# Patient Record
Sex: Female | Born: 1995 | Hispanic: Yes | Marital: Single | Smoking: Former smoker
Health system: Southern US, Community
[De-identification: ages and names within clinical notes are randomized; demographics above are authoritative.]

## PROBLEM LIST (undated history)

## (undated) DIAGNOSIS — IMO0002 Reserved for concepts with insufficient information to code with codable children: Secondary | ICD-10-CM

## (undated) DIAGNOSIS — M329 Systemic lupus erythematosus, unspecified: Secondary | ICD-10-CM

## (undated) DIAGNOSIS — E040 Nontoxic diffuse goiter: Secondary | ICD-10-CM

## (undated) DIAGNOSIS — D649 Anemia, unspecified: Secondary | ICD-10-CM

## (undated) DIAGNOSIS — N8003 Adenomyosis of the uterus: Secondary | ICD-10-CM

## (undated) DIAGNOSIS — N390 Urinary tract infection, site not specified: Secondary | ICD-10-CM

## (undated) DIAGNOSIS — H539 Unspecified visual disturbance: Secondary | ICD-10-CM

## (undated) DIAGNOSIS — F419 Anxiety disorder, unspecified: Secondary | ICD-10-CM

## (undated) DIAGNOSIS — F819 Developmental disorder of scholastic skills, unspecified: Secondary | ICD-10-CM

## (undated) DIAGNOSIS — K219 Gastro-esophageal reflux disease without esophagitis: Secondary | ICD-10-CM

## (undated) DIAGNOSIS — T7840XA Allergy, unspecified, initial encounter: Secondary | ICD-10-CM

## (undated) DIAGNOSIS — F32A Depression, unspecified: Secondary | ICD-10-CM

## (undated) DIAGNOSIS — G709 Myoneural disorder, unspecified: Secondary | ICD-10-CM

## (undated) HISTORY — DX: Developmental disorder of scholastic skills, unspecified: F81.9

## (undated) HISTORY — PX: NO PAST SURGERIES: SHX2092

---
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
## 2013-01-31 ENCOUNTER — Other Ambulatory Visit: Payer: Self-pay | Admitting: "Endocrinology

## 2013-04-29 ENCOUNTER — Encounter (HOSPITAL_COMMUNITY): Payer: Self-pay | Admitting: Emergency Medicine

## 2013-04-29 ENCOUNTER — Emergency Department (HOSPITAL_COMMUNITY)
Admission: EM | Admit: 2013-04-29 | Discharge: 2013-04-29 | Disposition: A | Discharge status: Home / Self Care | Payer: Medicaid Other | Attending: Emergency Medicine | Admitting: Emergency Medicine

## 2013-04-29 DIAGNOSIS — L509 Urticaria, unspecified: Secondary | ICD-10-CM

## 2013-04-29 DIAGNOSIS — Z8659 Personal history of other mental and behavioral disorders: Secondary | ICD-10-CM | POA: Insufficient documentation

## 2013-04-29 DIAGNOSIS — Z79899 Other long term (current) drug therapy: Secondary | ICD-10-CM | POA: Insufficient documentation

## 2013-04-29 DIAGNOSIS — Z87440 Personal history of urinary (tract) infections: Secondary | ICD-10-CM | POA: Insufficient documentation

## 2013-04-29 DIAGNOSIS — E119 Type 2 diabetes mellitus without complications: Secondary | ICD-10-CM | POA: Insufficient documentation

## 2013-04-29 DIAGNOSIS — Z794 Long term (current) use of insulin: Secondary | ICD-10-CM | POA: Insufficient documentation

## 2013-04-29 DIAGNOSIS — Z8669 Personal history of other diseases of the nervous system and sense organs: Secondary | ICD-10-CM | POA: Insufficient documentation

## 2013-04-29 DIAGNOSIS — J45909 Unspecified asthma, uncomplicated: Secondary | ICD-10-CM | POA: Insufficient documentation

## 2013-04-29 MED ORDER — DEXAMETHASONE SODIUM PHOSPHATE 10 MG/ML IJ SOLN
10.0000 mg | Freq: Once | INTRAMUSCULAR | Status: DC
  Filled 2013-04-29: qty 1

## 2013-04-29 MED ORDER — DEXAMETHASONE SODIUM PHOSPHATE 10 MG/ML IJ SOLN
10.0000 mg | Freq: Once | INTRAMUSCULAR | Status: AC
  Administered 2013-04-29: 10 mg via INTRAMUSCULAR

## 2013-04-29 MED ORDER — DIPHENHYDRAMINE HCL 25 MG PO CAPS
25.0000 mg | ORAL_CAPSULE | Freq: Once | ORAL | Status: AC
  Administered 2013-04-29: 25 mg via ORAL
  Filled 2013-04-29: qty 1

## 2013-04-29 NOTE — ED Provider Notes (Signed)
CSN: [id number]     Arrival date & time 04/29/13  2218 History   First MD Initiated Contact with Patient 04/29/13 2228     Chief Complaint  Patient presents with  . Rash   HPI  History provided by the patient. Patient is a 18 year old female with history of allergies to dust, asthma, diabetes who presents with complaints of worsening pruritic rash over her upper body. She first reports having an itchy spot on her right hand but later had several other spots appear on her arms, chest, neck and back. She states areas are very itchy. She has not used any medications or treatment for the symptoms. Denies any swelling of the lips, tongue or throat. No difficulty breathing or swallowing. She denies having similar reaction in the past. No change to her medications. Denies any new body lotions, soaps, shampoos, clothing or detergents. No contact with animals. No outdoor environmental contacts. She has not traveled or slept anywhere new recently.    Past Medical History  Diagnosis Date  . Allergy     dust   . Anxiety   . Asthma   . Diabetes mellitus   . Urinary tract infection   . Vision abnormalities     wears glasses  . Learning disability    Past Surgical History  Procedure Laterality Date  . No past surgeries     Family History  Problem Relation Age of Onset  . Adopted: Yes  . Early death Sister   . Asthma Maternal Aunt   . Depression Maternal Aunt   . Mental illness Maternal Aunt   . Asthma Maternal Grandmother   . Thyroid disease Maternal Grandmother   . Obesity Maternal Grandmother   . Heart disease Neg Hx   . Diabetes Neg Hx    History  Substance Use Topics  . Smoking status: Never Smoker   . Smokeless tobacco: Never Used  . Alcohol Use: No   OB History   Grav Para Term Preterm Abortions TAB SAB Ect Mult Living                 Review of Systems  Constitutional: Negative for fever and chills.  HENT: Negative for trouble swallowing and voice change.   Respiratory:  Negative for cough, shortness of breath, wheezing and stridor.   Skin: Positive for rash.  All other systems reviewed and are negative.      Allergies  Review of patient's allergies indicates no known allergies.  Home Medications   Current Outpatient Rx  Name  Route  Sig  Dispense  Refill  . ACCU-CHEK FASTCLIX LANCETS MISC   Does not apply   1 each by Does not apply route 3 (three) times daily. Check sugar 10 x daily   306 each   3     Lancets come in boxes of 102 each. Please dispense ...   . albuterol (PROVENTIL HFA;VENTOLIN HFA) 108 (90 BASE) MCG/ACT inhaler   Inhalation   Inhale 2 puffs into the lungs every 6 (six) hours as needed. For wheezing/shortness of breath         . insulin aspart protamine- aspart (NOVOLOG MIX 70/30) (70-30) 100 UNIT/ML injection   Subcutaneous   Inject 0.15 mLs (15 Units total) into the skin daily with supper.   10 mL   12   . insulin aspart protamine- aspart (NOVOLOG MIX 70/30) (70-30) 100 UNIT/ML injection   Subcutaneous   Inject 0.25 mLs (25 Units total) into the skin daily with breakfast.  10 mL   12   . Insulin Pen Needle 31G X 5 MM MISC      Use with insulin pen   200 each   6     For questions regarding this prescription please c ...    BP 127/75  Pulse 85  Temp(Src) 98.7 F (37.1 C) (Oral)  Resp 18  SpO2 98% Physical Exam  Nursing note and vitals reviewed. Constitutional: She is oriented to person, place, and time. She appears well-developed and well-nourished. No distress.  HENT:  Head: Normocephalic.  Mouth/Throat: Oropharynx is clear and moist.  Eyes: Conjunctivae are normal.  Neck: Normal range of motion. Neck supple. No tracheal deviation present.  Cardiovascular: Normal rate and regular rhythm.   Pulmonary/Chest: Effort normal and breath sounds normal. No stridor. No respiratory distress. She has no wheezes. She has no rales.  Abdominal: Soft.  Neurological: She is alert and oriented to person, place, and  time.  Skin: Skin is warm and dry. Rash noted. No erythema.  Urticarial type rash over bilateral arms, upper chest and neck and diffusely on the back.  Psychiatric: She has a normal mood and affect. Her behavior is normal.    ED Course  Procedures   DIAGNOSTIC STUDIES: Oxygen Saturation is 98% on room air.    COORDINATION OF CARE:  Nursing notes reviewed. Vital signs reviewed. Initial pt interview and examination performed.   10:32 PM-patient seen and evaluated. She appears well no acute distress. The lesions appear urticarial in nature. No specific history to indicate cause of allergic response. No swelling of the lips, tongue or throat area. Normal respirations. Discussed treat plan with pt at bedside, which includes IM dose of Decadron and Benadryl. Pt agrees with plan.     MDM   Final diagnoses:  Hives       Angus Sellereter S Ebert Forrester, PA-C 04/29/13 2250

## 2013-04-29 NOTE — ED Notes (Signed)
The lesions appear to be bug bites

## 2013-04-29 NOTE — ED Notes (Signed)
The  Pt is c/o a itching rash all over her body this afternoon.  No previous history.  No breathing difficulty.

## 2013-04-29 NOTE — ED Provider Notes (Signed)
Medical screening examination/treatment/procedure(s) were performed by non-physician practitioner and as supervising physician I was immediately available for consultation/collaboration.   EKG Interpretation None        Charles B. Sheldon, MD 04/29/13 2350 

## 2013-04-29 NOTE — ED Notes (Signed)
Pt just received IM injection.  Will be discharged in approx 30 min.

## 2013-04-29 NOTE — Discharge Instructions (Signed)
Please follow up with a primary care provider or dermatology specialist for continued evaluation and treatment.    Hives Hives are itchy, red, swollen areas of the skin. They can vary in size and location on your body. Hives can come and go for hours or several days (acute hives) or for several weeks (chronic hives). Hives do not spread from person to person (noncontagious). They may get worse with scratching, exercise, and emotional stress. CAUSES   Allergic reaction to food, additives, or drugs.  Infections, including the common cold.  Illness, such as vasculitis, lupus, or thyroid disease.  Exposure to sunlight, heat, or cold.  Exercise.  Stress.  Contact with chemicals. SYMPTOMS   Red or white swollen patches on the skin. The patches may change size, shape, and location quickly and repeatedly.  Itching.  Swelling of the hands, feet, and face. This may occur if hives develop deeper in the skin. DIAGNOSIS  Your caregiver can usually tell what is wrong by performing a physical exam. Skin or blood tests may also be done to determine the cause of your hives. In some cases, the cause cannot be determined. TREATMENT  Mild cases usually get better with medicines such as antihistamines. Severe cases may require an emergency epinephrine injection. If the cause of your hives is known, treatment includes avoiding that trigger.  HOME CARE INSTRUCTIONS   Avoid causes that trigger your hives.  Take antihistamines as directed by your caregiver to reduce the severity of your hives. Non-sedating or low-sedating antihistamines are usually recommended. Do not drive while taking an antihistamine.  Take any other medicines prescribed for itching as directed by your caregiver.  Wear loose-fitting clothing.  Keep all follow-up appointments as directed by your caregiver. SEEK MEDICAL CARE IF:   You have persistent or severe itching that is not relieved with medicine.  You have painful or  swollen joints. SEEK IMMEDIATE MEDICAL CARE IF:   You have a fever.  Your tongue or lips are swollen.  You have trouble breathing or swallowing.  You feel tightness in the throat or chest.  You have abdominal pain. These problems may be the first sign of a life-threatening allergic reaction. Call your local emergency services (911 in [place] MAKE SURE YOU:   Understand these instructions.  Will watch your condition.  Will get help right away if you are not doing well or get worse. Document Released: 02/01/2005 Document Revised: 08/03/2011 Document Reviewed: 04/27/2011 [facility] Patient Information 2014 [employer], [place].

## 2013-05-31 ENCOUNTER — Encounter (HOSPITAL_COMMUNITY): Payer: Self-pay | Admitting: Emergency Medicine

## 2013-05-31 ENCOUNTER — Emergency Department (HOSPITAL_COMMUNITY)
Admission: EM | Admit: 2013-05-31 | Discharge: 2013-05-31 | Discharge status: Against Medical Advice | Payer: Medicaid Other | Attending: Emergency Medicine | Admitting: Emergency Medicine

## 2013-05-31 DIAGNOSIS — E119 Type 2 diabetes mellitus without complications: Secondary | ICD-10-CM | POA: Insufficient documentation

## 2013-05-31 DIAGNOSIS — J45909 Unspecified asthma, uncomplicated: Secondary | ICD-10-CM | POA: Insufficient documentation

## 2013-05-31 LAB — URINE MICROSCOPIC-ADD ON

## 2013-05-31 LAB — URINALYSIS, ROUTINE W REFLEX MICROSCOPIC
Bilirubin Urine: NEGATIVE
Glucose, UA: >1000 mg/dL — AB
Hgb urine dipstick: NEGATIVE
Ketones, ur: 40 mg/dL — AB
Leukocytes, UA: NEGATIVE
Nitrite: NEGATIVE
Protein, ur: NEGATIVE mg/dL
Specific Gravity, Urine: 1.03 (ref 1.005–1.030)
Urobilinogen, UA: 0.2 mg/dL (ref 0.0–1.0)
pH: 6 (ref 5.0–8.0)

## 2013-05-31 LAB — COMPREHENSIVE METABOLIC PANEL
ALBUMIN: 3.9 g/dL (ref 3.5–5.2)
ALK PHOS: 68 U/L (ref 39–117)
ALT: 8 U/L (ref 0–35)
AST: 13 U/L (ref 0–37)
BUN: 13 mg/dL (ref 6–23)
CALCIUM: 10.6 mg/dL — AB (ref 8.4–10.5)
CO2: 19 mEq/L (ref 19–32)
CREATININE: 0.49 mg/dL — AB (ref 0.50–1.10)
Chloride: 88 mEq/L — ABNORMAL LOW (ref 96–112)
GFR calc non Af Amer: >90 mL/min (ref >90)
GLUCOSE: 868 mg/dL — AB (ref 70–99)
Potassium: 5 mEq/L (ref 3.7–5.3)
Sodium: 130 mEq/L — ABNORMAL LOW (ref 137–147)
TOTAL PROTEIN: 8.5 g/dL — AB (ref 6.0–8.3)
Total Bilirubin: 0.3 mg/dL (ref 0.3–1.2)

## 2013-05-31 LAB — CBC WITH DIFFERENTIAL/PLATELET
BASOS PCT: 0 % (ref 0–1)
Basophils Absolute: 0 10*3/uL (ref 0.0–0.1)
EOS ABS: 0.1 10*3/uL (ref 0.0–0.7)
EOS PCT: 1 % (ref 0–5)
HCT: 34.5 % — ABNORMAL LOW (ref 36.0–46.0)
HEMOGLOBIN: 11.1 g/dL — AB (ref 12.0–15.0)
LYMPHS ABS: 2.8 10*3/uL (ref 0.7–4.0)
Lymphocytes Relative: 40 % (ref 12–46)
MCH: 25.8 pg — AB (ref 26.0–34.0)
MCHC: 32.2 g/dL (ref 30.0–36.0)
MCV: 80 fL (ref 78.0–100.0)
MONO ABS: 0.5 10*3/uL (ref 0.1–1.0)
MONOS PCT: 7 % (ref 3–12)
Neutro Abs: 3.5 10*3/uL (ref 1.7–7.7)
Neutrophils Relative %: 52 % (ref 43–77)
Platelets: 624 10*3/uL — ABNORMAL HIGH (ref 150–400)
RBC: 4.31 MIL/uL (ref 3.87–5.11)
RDW: 14.4 % (ref 11.5–15.5)
WBC: 6.9 10*3/uL (ref 4.0–10.5)

## 2013-05-31 LAB — CBG MONITORING, ED: Glucose-Capillary: >600 mg/dL (ref 70–99)

## 2013-05-31 LAB — PREGNANCY, URINE: PREG TEST UR: NEGATIVE

## 2013-05-31 NOTE — ED Notes (Cosign Needed)
Pt in c/o not feeling well, states she has been nauseous, states her glucose levels have been running higher than normal, last check at home as 350, no distress noted, c/o abdominal pain- pt unsure if she is pregnant, LMS was two months ago which is not normal for her

## 2013-05-31 NOTE — ED Notes (Addendum)
Attempted to locate pt multiple times - unable to locate pt.

## 2013-05-31 NOTE — ED Notes (Signed)
Attempted to call patient via telephone and patient's emergency contact so we could notify patient of critical results. These numbers are no longer in service. Unable to locate any other numbers that could possibly put us in contact with patient.

## 2013-05-31 NOTE — ED Notes (Signed)
CRITICAL VALUE ALERT  Critical value received: Blood Glucose 868  Date of notification:  05/31/2013  Time of notification:  2256  Critical value read back:yes  Nurse who received alert:  Cassie FreerKimberly Wilho Sharpley, RN  MD notified: Dr. Juleen ChinaKohut, EDP

## 2013-05-31 NOTE — ED Notes (Signed)
Attempted to call pt multiple times - no response - pt not found after multiple attempts.

## 2013-05-31 NOTE — ED Notes (Signed)
Attempted to call patient to come back to ED to be seen.  No answer on cell phone.

## 2013-06-01 ENCOUNTER — Telehealth (HOSPITAL_BASED_OUTPATIENT_CLINIC_OR_DEPARTMENT_OTHER): Payer: Self-pay

## 2013-06-01 NOTE — Telephone Encounter (Signed)
Pt calling states she was left a message to call back about her labs.  Pt left prior to labs being drawn.   Per notes in chart by RN's were calling critical sugar on pt. Pt informed sugar 868 last night. Pt stated she checked her sugar today and it was 223.  Current Clinical research associatewriter asked pt if she took her meds yesterday and she didn't answer yes or no but stated she was "busy running around".  Advised pt to f/u w/PCP or return.  Pt voiced understanding.

## 2013-06-12 ENCOUNTER — Emergency Department (HOSPITAL_COMMUNITY)
Admission: EM | Admit: 2013-06-12 | Discharge: 2013-06-12 | Disposition: A | Discharge status: Home / Self Care | Payer: No Typology Code available for payment source | Attending: Emergency Medicine | Admitting: Emergency Medicine

## 2013-06-12 ENCOUNTER — Encounter (HOSPITAL_COMMUNITY): Payer: Self-pay | Admitting: Emergency Medicine

## 2013-06-12 DIAGNOSIS — Z794 Long term (current) use of insulin: Secondary | ICD-10-CM | POA: Insufficient documentation

## 2013-06-12 DIAGNOSIS — Z3202 Encounter for pregnancy test, result negative: Secondary | ICD-10-CM | POA: Diagnosis not present

## 2013-06-12 DIAGNOSIS — E119 Type 2 diabetes mellitus without complications: Secondary | ICD-10-CM | POA: Diagnosis not present

## 2013-06-12 DIAGNOSIS — IMO0002 Reserved for concepts with insufficient information to code with codable children: Secondary | ICD-10-CM | POA: Insufficient documentation

## 2013-06-12 DIAGNOSIS — Y9389 Activity, other specified: Secondary | ICD-10-CM | POA: Insufficient documentation

## 2013-06-12 DIAGNOSIS — Z79899 Other long term (current) drug therapy: Secondary | ICD-10-CM | POA: Diagnosis not present

## 2013-06-12 DIAGNOSIS — F8189 Other developmental disorders of scholastic skills: Secondary | ICD-10-CM | POA: Insufficient documentation

## 2013-06-12 DIAGNOSIS — J45909 Unspecified asthma, uncomplicated: Secondary | ICD-10-CM | POA: Diagnosis not present

## 2013-06-12 DIAGNOSIS — Z87440 Personal history of urinary (tract) infections: Secondary | ICD-10-CM | POA: Insufficient documentation

## 2013-06-12 DIAGNOSIS — Z8659 Personal history of other mental and behavioral disorders: Secondary | ICD-10-CM | POA: Diagnosis not present

## 2013-06-12 DIAGNOSIS — Y92410 Unspecified street and highway as the place of occurrence of the external cause: Secondary | ICD-10-CM | POA: Insufficient documentation

## 2013-06-12 DIAGNOSIS — Z789 Other specified health status: Secondary | ICD-10-CM | POA: Insufficient documentation

## 2013-06-12 DIAGNOSIS — M549 Dorsalgia, unspecified: Secondary | ICD-10-CM

## 2013-06-12 LAB — POC URINE PREG, ED: PREG TEST UR: NEGATIVE

## 2013-06-12 MED ORDER — MELOXICAM 15 MG PO TABS: 15.0000 mg | ORAL_TABLET | Freq: Every day | ORAL | Status: DC

## 2013-06-12 MED ORDER — NAPROXEN 500 MG PO TABS: 500.0000 mg | ORAL_TABLET | Freq: Two times a day (BID) | ORAL | Status: DC
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
## 2013-06-12 NOTE — Discharge Instructions (Signed)
Take Naprosyn for pain. Refer to attached documents for more information. Apply heat to the affected area. Follow up with your doctor as needed.

## 2013-06-12 NOTE — ED Notes (Signed)
Pt states she has not had a menstrual cycle in 2 months.

## 2013-06-14 NOTE — ED Provider Notes (Signed)
Medical screening examination/treatment/procedure(s) were performed by non-physician practitioner and as supervising physician I was immediately available for consultation/collaboration.  Denys Labree Y. Janard Culp, MD 06/14/13 0728 

## 2013-07-28 ENCOUNTER — Inpatient Hospital Stay (HOSPITAL_COMMUNITY)
Admission: EM | Admit: 2013-07-28 | Discharge: 2013-07-30 | DRG: 639 | Disposition: A | Discharge status: Home / Self Care | Payer: Medicaid Other | Attending: Internal Medicine | Admitting: Internal Medicine

## 2013-07-28 ENCOUNTER — Encounter (HOSPITAL_COMMUNITY): Payer: Self-pay | Admitting: Emergency Medicine

## 2013-07-28 DIAGNOSIS — N926 Irregular menstruation, unspecified: Secondary | ICD-10-CM | POA: Diagnosis present

## 2013-07-28 DIAGNOSIS — E1010 Type 1 diabetes mellitus with ketoacidosis without coma: Principal | ICD-10-CM | POA: Diagnosis present

## 2013-07-28 DIAGNOSIS — Z825 Family history of asthma and other chronic lower respiratory diseases: Secondary | ICD-10-CM

## 2013-07-28 DIAGNOSIS — J45909 Unspecified asthma, uncomplicated: Secondary | ICD-10-CM

## 2013-07-28 DIAGNOSIS — Z79899 Other long term (current) drug therapy: Secondary | ICD-10-CM

## 2013-07-28 DIAGNOSIS — F8189 Other developmental disorders of scholastic skills: Secondary | ICD-10-CM | POA: Diagnosis present

## 2013-07-28 DIAGNOSIS — E1110 Type 2 diabetes mellitus with ketoacidosis without coma: Secondary | ICD-10-CM

## 2013-07-28 DIAGNOSIS — Z818 Family history of other mental and behavioral disorders: Secondary | ICD-10-CM

## 2013-07-28 DIAGNOSIS — F411 Generalized anxiety disorder: Secondary | ICD-10-CM | POA: Diagnosis present

## 2013-07-28 DIAGNOSIS — Z794 Long term (current) use of insulin: Secondary | ICD-10-CM

## 2013-07-28 DIAGNOSIS — K219 Gastro-esophageal reflux disease without esophagitis: Secondary | ICD-10-CM | POA: Diagnosis present

## 2013-07-28 DIAGNOSIS — F319 Bipolar disorder, unspecified: Secondary | ICD-10-CM

## 2013-07-28 DIAGNOSIS — E109 Type 1 diabetes mellitus without complications: Secondary | ICD-10-CM

## 2013-07-28 LAB — LIPASE, BLOOD: Lipase: 31 U/L (ref 11–59)

## 2013-07-28 LAB — CBC WITH DIFFERENTIAL/PLATELET
BASOS ABS: 0 10*3/uL (ref 0.0–0.1)
Basophils Relative: 0 % (ref 0–1)
EOS PCT: 1 % (ref 0–5)
Eosinophils Absolute: 0.1 10*3/uL (ref 0.0–0.7)
HCT: 38.5 % (ref 36.0–46.0)
Hemoglobin: 12.3 g/dL (ref 12.0–15.0)
LYMPHS ABS: 2.6 10*3/uL (ref 0.7–4.0)
Lymphocytes Relative: 36 % (ref 12–46)
MCH: 24.4 pg — AB (ref 26.0–34.0)
MCHC: 31.9 g/dL (ref 30.0–36.0)
MCV: 76.2 fL — ABNORMAL LOW (ref 78.0–100.0)
Monocytes Absolute: 0.4 10*3/uL (ref 0.1–1.0)
Monocytes Relative: 6 % (ref 3–12)
NEUTROS PCT: 57 % (ref 43–77)
Neutro Abs: 4 10*3/uL (ref 1.7–7.7)
PLATELETS: 411 10*3/uL — AB (ref 150–400)
RBC: 5.05 MIL/uL (ref 3.87–5.11)
RDW: 14.5 % (ref 11.5–15.5)
WBC: 7.1 10*3/uL (ref 4.0–10.5)

## 2013-07-28 LAB — COMPREHENSIVE METABOLIC PANEL
ALK PHOS: 71 U/L (ref 39–117)
ALT: 7 U/L (ref 0–35)
AST: 13 U/L (ref 0–37)
Albumin: 3.9 g/dL (ref 3.5–5.2)
BUN: 8 mg/dL (ref 6–23)
CALCIUM: 9.3 mg/dL (ref 8.4–10.5)
CO2: 23 mEq/L (ref 19–32)
Chloride: 94 mEq/L — ABNORMAL LOW (ref 96–112)
Creatinine, Ser: 0.53 mg/dL (ref 0.50–1.10)
GFR calc non Af Amer: >90 mL/min (ref >90)
GLUCOSE: 596 mg/dL — AB (ref 70–99)
POTASSIUM: 4.3 meq/L (ref 3.7–5.3)
SODIUM: 132 meq/L — AB (ref 137–147)
TOTAL PROTEIN: 8.1 g/dL (ref 6.0–8.3)
Total Bilirubin: 0.3 mg/dL (ref 0.3–1.2)

## 2013-07-28 LAB — URINE MICROSCOPIC-ADD ON

## 2013-07-28 LAB — CBG MONITORING, ED
GLUCOSE-CAPILLARY: 490 mg/dL — AB (ref 70–99)
Glucose-Capillary: 372 mg/dL — ABNORMAL HIGH (ref 70–99)

## 2013-07-28 LAB — PREGNANCY, URINE: Preg Test, Ur: NEGATIVE

## 2013-07-28 LAB — URINALYSIS, ROUTINE W REFLEX MICROSCOPIC
BILIRUBIN URINE: NEGATIVE
HGB URINE DIPSTICK: NEGATIVE
KETONES UR: 15 mg/dL — AB
LEUKOCYTES UA: NEGATIVE
NITRITE: NEGATIVE
Protein, ur: NEGATIVE mg/dL
Specific Gravity, Urine: 1.041 — ABNORMAL HIGH (ref 1.005–1.030)
UROBILINOGEN UA: 0.2 mg/dL (ref 0.0–1.0)
pH: 6 (ref 5.0–8.0)

## 2013-07-28 MED ORDER — SODIUM CHLORIDE 0.9 % IV BOLUS (SEPSIS)
1000.0000 mL | Freq: Once | INTRAVENOUS | Status: AC
  Administered 2013-07-28: 1000 mL via INTRAVENOUS

## 2013-07-28 MED ORDER — DEXTROSE-NACL 5-0.45 % IV SOLN
INTRAVENOUS | Status: DC
  Administered 2013-07-28: 23:00:00 via INTRAVENOUS

## 2013-07-28 MED ORDER — INSULIN ASPART 100 UNIT/ML ~~LOC~~ SOLN
10.0000 [IU] | Freq: Once | SUBCUTANEOUS | Status: DC
  Filled 2013-07-28: qty 1

## 2013-07-28 MED ORDER — DEXTROSE 50 % IV SOLN: 25.0000 mL | INTRAVENOUS | Status: DC | PRN

## 2013-07-28 MED ORDER — SODIUM CHLORIDE 0.9 % IV SOLN
INTRAVENOUS | Status: DC
  Administered 2013-07-29 – 2013-07-30 (×3): via INTRAVENOUS

## 2013-07-28 MED ORDER — SODIUM CHLORIDE 0.9 % IV SOLN
INTRAVENOUS | Status: DC
  Administered 2013-07-29: via INTRAVENOUS

## 2013-07-28 MED ORDER — SODIUM CHLORIDE 0.9 % IV SOLN
INTRAVENOUS | Status: DC
  Administered 2013-07-28: 5.4 [IU]/h via INTRAVENOUS
  Filled 2013-07-28: qty 1

## 2013-07-28 MED ORDER — ALBUTEROL SULFATE (2.5 MG/3ML) 0.083% IN NEBU
2.5000 mg | INHALATION_SOLUTION | Freq: Four times a day (QID) | RESPIRATORY_TRACT | Status: DC
  Filled 2013-07-28: qty 3

## 2013-07-28 MED ORDER — POTASSIUM CHLORIDE 10 MEQ/100ML IV SOLN
10.0000 meq | INTRAVENOUS | Status: AC
  Administered 2013-07-29 (×2): 10 meq via INTRAVENOUS
  Filled 2013-07-28 (×2): qty 100

## 2013-07-28 MED ORDER — GI COCKTAIL ~~LOC~~
30.0000 mL | Freq: Once | ORAL | Status: AC
  Administered 2013-07-28: 30 mL via ORAL
  Filled 2013-07-28: qty 30

## 2013-07-28 MED ORDER — ACETAMINOPHEN 325 MG PO TABS: 650.0000 mg | ORAL_TABLET | Freq: Four times a day (QID) | ORAL | Status: DC | PRN

## 2013-07-28 MED ORDER — ONDANSETRON HCL 4 MG/2ML IJ SOLN: 4.0000 mg | Freq: Four times a day (QID) | INTRAMUSCULAR | Status: DC | PRN

## 2013-07-28 MED ORDER — PANTOPRAZOLE SODIUM 40 MG PO TBEC
40.0000 mg | DELAYED_RELEASE_TABLET | Freq: Every day | ORAL | Status: DC
  Administered 2013-07-29 – 2013-07-30 (×2): 40 mg via ORAL
  Filled 2013-07-28 (×2): qty 1

## 2013-07-28 MED ORDER — DEXTROSE-NACL 5-0.45 % IV SOLN: INTRAVENOUS | Status: DC

## 2013-07-28 MED ORDER — ENOXAPARIN SODIUM 40 MG/0.4ML ~~LOC~~ SOLN
40.0000 mg | Freq: Every day | SUBCUTANEOUS | Status: DC
  Administered 2013-07-29 – 2013-07-30 (×2): 40 mg via SUBCUTANEOUS
  Filled 2013-07-28 (×2): qty 0.4

## 2013-07-28 NOTE — H&P (Signed)
[facility] History and Physical  [name] JIR:[id number] DOB: [birth year] DOA: 07/28/2013   PCP: Arnoldo Morale, MD  Specialists: Dr. Bynum Bellows With [facility]  Chief Complaint: High blood sugars  HPI: [name] is a 18 y.o. female with a past medical history of insulin-dependent diabetes for the last one year, asthma, bipolar disorder, who was in her usual state of health till about 2-3 days ago, when she started noticing that her blood sugar was quite high. She tells me that usually her blood sugars run in the 200s. She denies any changes in the dosage of her Levemir. Denies missing any of her insulin. Denies any nausea, vomiting, or diarrhea. No fever. No chills. Last menstrual period was May 12. She reports irregular periods. She's also had upper abdominal pain, which she attributes to acid reflux. She checked her blood sugar today, and it read as 'high'. So, she decided to come in to the hospital for further evaluation. History is limited. The above information is questionable as patient mentioned to me that she was diagnosed with diabetes last year however based on her records she's had at least for 2 years.  Home Medications: Prior to Admission medications   Medication Sig Start Date End Date Taking? Authorizing Provider  albuterol (PROVENTIL HFA;VENTOLIN HFA) 108 (90 BASE) MCG/ACT inhaler Inhale 2 puffs into the lungs every 6 (six) hours as needed for wheezing or shortness of breath.    Yes Historical Provider, MD  insulin aspart (NOVOLOG FLEXPEN) 100 UNIT/ML FlexPen Inject 3-8 Units into the skin 3 (three) times daily with meals. Per sliding scale   Yes Historical Provider, MD  Insulin Detemir (LEVEMIR FLEXPEN) 100 UNIT/ML Pen Inject 30 Units into the skin 2 (two) times daily. 10am and 10pm   Yes Historical Provider, MD  Insulin Pen Needle 31G X 5 MM MISC Use with insulin pen 08/17/12   Sherrlyn Hock, MD    Allergies: No Known Allergies  Past Medical  History: Past Medical History  Diagnosis Date  . Allergy     dust   . Anxiety   . Asthma   . Diabetes mellitus   . Urinary tract infection   . Vision abnormalities     wears glasses  . Learning disability     Past Surgical History  Procedure Laterality Date  . No past surgeries      Social History: She lives with her sisters in [place]. Denies smoking, alcohol use or illicit drug use. Usually independent with daily activities  Family History:  Family History  Problem Relation Age of Onset  . Adopted: Yes  . Early death Sister   . Asthma Maternal Aunt   . Depression Maternal Aunt   . Mental illness Maternal Aunt   . Asthma Maternal Grandmother   . Thyroid disease Maternal Grandmother   . Obesity Maternal Grandmother   . Heart disease Neg Hx   . Diabetes Neg Hx      Review of Systems - History obtained from the patient General ROS: negative Psychological ROS: negative Ophthalmic ROS: negative ENT ROS: negative Allergy and Immunology ROS: negative Hematological and Lymphatic ROS: negative Endocrine ROS: negative Respiratory ROS: no cough, shortness of breath, or wheezing Cardiovascular ROS: no chest pain or dyspnea on exertion Gastrointestinal ROS: as in hpi Genito-Urinary ROS: no dysuria, trouble voiding, or hematuria Musculoskeletal ROS: negative Neurological ROS: no TIA or stroke symptoms Dermatological ROS: negative  Physical Examination  Filed Vitals:   07/28/13 1943 07/28/13 2000 07/28/13 2015  07/28/13 2034  BP: 129/57 108/63 109/64 102/65  Pulse: 79 71 82 83  Temp:      TempSrc:      Resp: 18 13 0 16  SpO2: 99% 100% 98% 99%    BP 102/65  Pulse 83  Temp(Src) 98.8 F (37.1 C) (Oral)  Resp 16  SpO2 99%  LMP 06/26/2013  General appearance: alert, cooperative, appears stated age and no distress Head: Normocephalic, without obvious abnormality, atraumatic Eyes: conjunctivae/corneas clear. PERRL, EOM's intact.  Throat: lips, mucosa, and  tongue normal; teeth and gums normal Neck: no adenopathy, no carotid bruit, no JVD, supple, symmetrical, trachea midline and thyroid not enlarged, symmetric, no tenderness/mass/nodules Resp: clear to auscultation bilaterally Cardio: regular rate and rhythm, S1, S2 normal, no murmur, click, rub or gallop GI: soft, non-tender; bowel sounds normal; no masses,  no organomegaly Extremities: extremities normal, atraumatic, no cyanosis or edema Pulses: 2+ and symmetric Skin: Skin color, texture, turgor normal. No rashes or lesions Neurologic: No focal deficits  Laboratory Data: Results for orders placed during the hospital encounter of 07/28/13 (from the past 48 hour(s))  CBG MONITORING, ED     Status: Abnormal   Collection Time    07/28/13  5:14 PM      Result Value Ref Range   Glucose-Capillary 490 (*) 70 - 99 mg/dL  URINALYSIS, ROUTINE W REFLEX MICROSCOPIC     Status: Abnormal   Collection Time    07/28/13  5:29 PM      Result Value Ref Range   Color, Urine YELLOW  YELLOW   APPearance CLOUDY (*) CLEAR   Specific Gravity, Urine 1.041 (*) 1.005 - 1.030   pH 6.0  5.0 - 8.0   Glucose, UA >1000 (*) NEGATIVE mg/dL   Hgb urine dipstick NEGATIVE  NEGATIVE   Bilirubin Urine NEGATIVE  NEGATIVE   Ketones, ur 15 (*) NEGATIVE mg/dL   Protein, ur NEGATIVE  NEGATIVE mg/dL   Urobilinogen, UA 0.2  0.0 - 1.0 mg/dL   Nitrite NEGATIVE  NEGATIVE   Leukocytes, UA NEGATIVE  NEGATIVE  PREGNANCY, URINE     Status: None   Collection Time    07/28/13  5:29 PM      Result Value Ref Range   Preg Test, Ur NEGATIVE  NEGATIVE   Comment:            THE SENSITIVITY OF THIS     METHODOLOGY IS >20 mIU/mL.  URINE MICROSCOPIC-ADD ON     Status: Abnormal   Collection Time    07/28/13  5:29 PM      Result Value Ref Range   Squamous Epithelial / LPF FEW (*) RARE   WBC, UA 3-6  <3 WBC/hpf  CBC WITH DIFFERENTIAL     Status: Abnormal   Collection Time    07/28/13  5:39 PM      Result Value Ref Range   WBC 7.1   4.0 - 10.5 K/uL   RBC 5.05  3.87 - 5.11 MIL/uL   Hemoglobin 12.3  12.0 - 15.0 g/dL   HCT 38.5  36.0 - 46.0 %   MCV 76.2 (*) 78.0 - 100.0 fL   MCH 24.4 (*) 26.0 - 34.0 pg   MCHC 31.9  30.0 - 36.0 g/dL   RDW 14.5  11.5 - 15.5 %   Platelets 411 (*) 150 - 400 K/uL   Neutrophils Relative % 57  43 - 77 %   Neutro Abs 4.0  1.7 - 7.7 K/uL   Lymphocytes  Relative 36  12 - 46 %   Lymphs Abs 2.6  0.7 - 4.0 K/uL   Monocytes Relative 6  3 - 12 %   Monocytes Absolute 0.4  0.1 - 1.0 K/uL   Eosinophils Relative 1  0 - 5 %   Eosinophils Absolute 0.1  0.0 - 0.7 K/uL   Basophils Relative 0  0 - 1 %   Basophils Absolute 0.0  0.0 - 0.1 K/uL  COMPREHENSIVE METABOLIC PANEL     Status: Abnormal   Collection Time    07/28/13  5:39 PM      Result Value Ref Range   Sodium 132 (*) 137 - 147 mEq/L   Potassium 4.3  3.7 - 5.3 mEq/L   Chloride 94 (*) 96 - 112 mEq/L   CO2 23  19 - 32 mEq/L   Glucose, Bld 596 (*) 70 - 99 mg/dL   Comment: REPEATED TO VERIFY     CRITICAL RESULT CALLED TO, READ BACK BY AND VERIFIED WITH:     SHERWOOD,W RN 07/28/13 1830 WOOTEN,K   BUN 8  6 - 23 mg/dL   Creatinine, Ser 0.53  0.50 - 1.10 mg/dL   Calcium 9.3  8.4 - 10.5 mg/dL   Total Protein 8.1  6.0 - 8.3 g/dL   Albumin 3.9  3.5 - 5.2 g/dL   AST 13  0 - 37 U/L   ALT 7  0 - 35 U/L   Alkaline Phosphatase 71  39 - 117 U/L   Total Bilirubin 0.3  0.3 - 1.2 mg/dL   GFR calc non Af Amer >90  >90 mL/min   GFR calc Af Amer >90  >90 mL/min   Comment: (NOTE)     The eGFR has been calculated using the CKD EPI equation.     This calculation has not been validated in all clinical situations.     eGFR's persistently <90 mL/min signify possible Chronic Kidney     Disease.  LIPASE, BLOOD     Status: None   Collection Time    07/28/13  5:39 PM      Result Value Ref Range   Lipase 31  11 - 59 U/L  CBG MONITORING, ED     Status: Abnormal   Collection Time    07/28/13  7:49 PM      Result Value Ref Range   Glucose-Capillary >600 (*) 70 -  99 mg/dL    Radiology Reports: No results found.   Problem List  Principal Problem:   DKA (diabetic ketoacidoses) Active Problems:   Bipolar disorder   Asthma   Assessment: This is a 18 year old female with the history of insulin-dependent diabetes, who presents with hyperglycemia. She has some ketones in the urine. Anion gap is only 15. She likely has mild diabetic ketoacidosis with significant hyperglycemia. Etiology is unclear. Compliance remains questionable.  Plan: #1 diabetic ketoacidosis: This is mild with anion gap of only 15. She'll be placed on the DKA protocol with insulin infusion. Will check an HbA1c. Her last A1c in our system was greater than 14, but this was in April of 2014. Metabolic panel will be checked frequently.  #2 history of asthma: Continue with albuterol as needed.   #3 history of bipolar disorder: Doesn't appear to be on any medications.  #4 upper abdominal pain: Abdominal examination was completely benign. Lipase is normal. LFTs are normal. This is most likely related to her acid reflux disease. We will give her PPI.   DVT Prophylaxis: Lovenox  Code Status: Full code Family Communication: No family at bedside. Discussed with patient  Disposition Plan: Admit to step down   Further management decisions will depend on results of further testing and patient's response to treatment.   [facility]  [facility] Pager [phone]  If 7PM-7AM, please contact night-coverage [email or web address] Password [facility]  07/28/2013, 10:04 PM  Disclaimer: This note was dictated with voice recognition software. Similar sounding words can inadvertently be transcribed and may not be corrected upon review.

## 2013-07-28 NOTE — ED Notes (Signed)
The pt has high glucose.  She has had for several weeks and she has been seen  Numerus times in eds over the past 2 weeks.  She was diagnosed with a uti at [facility] but was never  Given a rx for the same.  C.o abd pain also,  lmp may 12th

## 2013-07-28 NOTE — ED Provider Notes (Signed)
CSN: [id number]     Arrival date & time 07/28/13  1656 History   First MD Initiated Contact with Patient 07/28/13 1751     Chief Complaint  Patient presents with  . Hyperglycemia   HPI  [name] is a 18 y.o. female with a PMH of insulin dependent DM, asthma, anxiety, allergies, UTI, learning disability and vision abnormality who presents to the ED for evaluation of hyperglycemia.  History was provided by the patient. Patient states that she has been to [facility] several times over the past two weeks for hyperglycemia and has been sent home. She has had a few admissions for this. She states that when she has hyperglycemia she feels dizzy. Her meter at home keeps reading "high." Her last ED visit was two days ago. She states that she went to a coma in the past for hyperglycemia and is worried about this. She also states she was dx with a UTI however they did not prescribe her any antibiotics. She also has had unchanged epigastric abdominal pain for the past few months. She denies pain currently but states that her pain is worse with eating. She also has nausea with eating. No emesis, diarrhea, constipation, dysuria, vaginal discharge/bleeding, fevers, chills, or change in appetite/activity. She takes levemir 30 units twice daily and humalog sliding scale. She also states her asthma has been "flaring up" lately, which has been relieved by her albuterol inhaler at home. No cough, chest pain, dyspnea, or SOB currently.    Past Medical History  Diagnosis Date  . Allergy     dust   . Anxiety   . Asthma   . Diabetes mellitus   . Urinary tract infection   . Vision abnormalities     wears glasses  . Learning disability    Past Surgical History  Procedure Laterality Date  . No past surgeries     Family History  Problem Relation Age of Onset  . Adopted: Yes  . Early death Sister   . Asthma Maternal Aunt   . Depression Maternal Aunt   . Mental illness Maternal Aunt   . Asthma  Maternal Grandmother   . Thyroid disease Maternal Grandmother   . Obesity Maternal Grandmother   . Heart disease Neg Hx   . Diabetes Neg Hx    History  Substance Use Topics  . Smoking status: Never Smoker   . Smokeless tobacco: Never Used  . Alcohol Use: No   OB History   Grav Para Term Preterm Abortions TAB SAB Ect Mult Living                 Review of Systems  Constitutional: Negative for fever, chills, activity change, appetite change and fatigue.  HENT: Negative for congestion, rhinorrhea and sore throat.   Respiratory: Positive for wheezing (intermittent). Negative for cough and shortness of breath.   Cardiovascular: Negative for chest pain and leg swelling.  Gastrointestinal: Positive for nausea (intermittent) and abdominal pain (chronic). Negative for vomiting, diarrhea and constipation.  Genitourinary: Negative for dysuria, urgency, frequency, hematuria, flank pain, decreased urine volume, vaginal bleeding, vaginal discharge, difficulty urinating, genital sores, vaginal pain and pelvic pain.  Musculoskeletal: Negative for back pain and myalgias.  Skin: Negative for rash.  Neurological: Positive for dizziness. Negative for weakness, light-headedness, numbness and headaches.    Allergies  Review of patient's allergies indicates no known allergies.  Home Medications   Prior to Admission medications   Medication Sig Start Date End Date Taking? Authorizing  Provider  albuterol (PROVENTIL HFA;VENTOLIN HFA) 108 (90 BASE) MCG/ACT inhaler Inhale 2 puffs into the lungs every 6 (six) hours as needed. For wheezing/shortness of breath    Historical Provider, MD  insulin detemir (LEVEMIR) 100 UNIT/ML injection Inject 30 Units into the skin at bedtime.    Historical Provider, MD  insulin lispro (HUMALOG) 100 UNIT/ML injection Inject into the skin 3 (three) times daily before meals.    Historical Provider, MD  Insulin Pen Needle 31G X 5 MM MISC Use with insulin pen 08/17/12   David Stall, MD  meloxicam (MOBIC) 15 MG tablet Take 1 tablet (15 mg total) by mouth daily. 06/12/13   Kaitlyn Szekalski, PA-C   BP 110/65  Pulse 102  Temp(Src) 98.8 F (37.1 C) (Oral)  Resp 14  SpO2 95%  LMP 06/26/2013  Filed Vitals:   07/28/13 2130 07/28/13 2145 07/28/13 2242 07/28/13 2304  BP: 96/50 109/49 91/41 111/63  Pulse: 86 93  76  Temp:    98.2 F (36.8 C)  TempSrc:    Oral  Resp: 24 18 16    Height:    5\' 4"  (1.626 m)  Weight:    156 lb 8.4 oz (71 kg)  SpO2: 98% 99%  98%    Physical Exam  Nursing note and vitals reviewed. Constitutional: She is oriented to person, place, and time. She appears well-developed and well-nourished. No distress.  HENT:  Head: Normocephalic and atraumatic.  Right Ear: External ear normal.  Left Ear: External ear normal.  Mouth/Throat: Oropharynx is clear and moist.  Eyes: Conjunctivae are normal. Right eye exhibits no discharge. Left eye exhibits no discharge.  Neck: Normal range of motion. Neck supple.  Cardiovascular: Normal rate, regular rhythm and normal heart sounds.  Exam reveals no gallop and no friction rub.   No murmur heard. Pulmonary/Chest: Effort normal and breath sounds normal. No respiratory distress. She has no wheezes. She has no rales. She exhibits no tenderness.  Abdominal: Soft. Bowel sounds are normal. She exhibits no distension and no mass. There is no tenderness. There is no rebound and no guarding.  Musculoskeletal: Normal range of motion. She exhibits no edema and no tenderness.  Neurological: She is alert and oriented to person, place, and time.  Skin: Skin is warm and dry. She is not diaphoretic.     ED Course  Procedures (including critical care time) Labs Review Labs Reviewed  CBG MONITORING, ED - Abnormal; Notable for the following:    Glucose-Capillary 490 (*)    All other components within normal limits  PREGNANCY, URINE  URINALYSIS, ROUTINE W REFLEX MICROSCOPIC  CBC WITH DIFFERENTIAL  COMPREHENSIVE  METABOLIC PANEL    Imaging Review No results found.   EKG Interpretation None      Results for orders placed during the hospital encounter of 07/28/13  URINALYSIS, ROUTINE W REFLEX MICROSCOPIC      Result Value Ref Range   Color, Urine YELLOW  YELLOW   APPearance CLOUDY (*) CLEAR   Specific Gravity, Urine 1.041 (*) 1.005 - 1.030   pH 6.0  5.0 - 8.0   Glucose, UA >1000 (*) NEGATIVE mg/dL   Hgb urine dipstick NEGATIVE  NEGATIVE   Bilirubin Urine NEGATIVE  NEGATIVE   Ketones, ur 15 (*) NEGATIVE mg/dL   Protein, ur NEGATIVE  NEGATIVE mg/dL   Urobilinogen, UA 0.2  0.0 - 1.0 mg/dL   Nitrite NEGATIVE  NEGATIVE   Leukocytes, UA NEGATIVE  NEGATIVE  PREGNANCY, URINE  Result Value Ref Range   Preg Test, Ur NEGATIVE  NEGATIVE  CBC WITH DIFFERENTIAL      Result Value Ref Range   WBC 7.1  4.0 - 10.5 K/uL   RBC 5.05  3.87 - 5.11 MIL/uL   Hemoglobin 12.3  12.0 - 15.0 g/dL   HCT 40.9  81.1 - 91.4 %   MCV 76.2 (*) 78.0 - 100.0 fL   MCH 24.4 (*) 26.0 - 34.0 pg   MCHC 31.9  30.0 - 36.0 g/dL   RDW 78.2  95.6 - 21.3 %   Platelets 411 (*) 150 - 400 K/uL   Neutrophils Relative % 57  43 - 77 %   Neutro Abs 4.0  1.7 - 7.7 K/uL   Lymphocytes Relative 36  12 - 46 %   Lymphs Abs 2.6  0.7 - 4.0 K/uL   Monocytes Relative 6  3 - 12 %   Monocytes Absolute 0.4  0.1 - 1.0 K/uL   Eosinophils Relative 1  0 - 5 %   Eosinophils Absolute 0.1  0.0 - 0.7 K/uL   Basophils Relative 0  0 - 1 %   Basophils Absolute 0.0  0.0 - 0.1 K/uL  COMPREHENSIVE METABOLIC PANEL      Result Value Ref Range   Sodium 132 (*) 137 - 147 mEq/L   Potassium 4.3  3.7 - 5.3 mEq/L   Chloride 94 (*) 96 - 112 mEq/L   CO2 23  19 - 32 mEq/L   Glucose, Bld 596 (*) 70 - 99 mg/dL   BUN 8  6 - 23 mg/dL   Creatinine, Ser 0.86  0.50 - 1.10 mg/dL   Calcium 9.3  8.4 - 57.8 mg/dL   Total Protein 8.1  6.0 - 8.3 g/dL   Albumin 3.9  3.5 - 5.2 g/dL   AST 13  0 - 37 U/L   ALT 7  0 - 35 U/L   Alkaline Phosphatase 71  39 - 117 U/L    Total Bilirubin 0.3  0.3 - 1.2 mg/dL   GFR calc non Af Amer >90  >90 mL/min   GFR calc Af Amer >90  >90 mL/min  URINE MICROSCOPIC-ADD ON      Result Value Ref Range   Squamous Epithelial / LPF FEW (*) RARE   WBC, UA 3-6  <3 WBC/hpf  LIPASE, BLOOD      Result Value Ref Range   Lipase 31  11 - 59 U/L     MDM   Benicia Bergevin is a 18 y.o. female with a PMH of insulin dependent DM, asthma, anxiety, allergies, UTI, learning disability and vision abnormality who presents to the ED for evaluation of hyperglycemia. Patient admitted for DKA and further evaluation and management of her hyperglycemia. AG 15 with ketonuria. Patient given 2L IV fluids and started on a glucose stabilizer in the ED. Also complained of chronic unchanged epigastric abdominal pain which resolved with GI cocktail. Abdominal exam benign. Vital signs stable. Patient afebrile and non-toxic in appearance.    Rechecks  5:15 PM = Blood sugar 490  7:45 PM = Blood sugar >600. Ordering insulin drip.  9:30 PM = Patient states her epigastric pain has resolved after GI cocktail. No other concerns. Ok with admission.    Final impressions: 1. DKA (diabetic ketoacidoses)   2. Asthma   3. Bipolar disorder      Luiz Iron PA-C   This patient was discussed with Dr. Silverio Lay  Jillyn LedgerJessica K Haeven Nickle, PA-C 07/29/13 [phone]

## 2013-07-29 DIAGNOSIS — E109 Type 1 diabetes mellitus without complications: Secondary | ICD-10-CM

## 2013-07-29 LAB — BASIC METABOLIC PANEL
BUN: 5 mg/dL — ABNORMAL LOW (ref 6–23)
BUN: 5 mg/dL — ABNORMAL LOW (ref 6–23)
BUN: 5 mg/dL — ABNORMAL LOW (ref 6–23)
BUN: 5 mg/dL — ABNORMAL LOW (ref 6–23)
BUN: 6 mg/dL (ref 6–23)
BUN: 8 mg/dL (ref 6–23)
CALCIUM: 9.2 mg/dL (ref 8.4–10.5)
CHLORIDE: 104 meq/L (ref 96–112)
CHLORIDE: 105 meq/L (ref 96–112)
CO2: 20 mEq/L (ref 19–32)
CO2: 21 mEq/L (ref 19–32)
CO2: 22 mEq/L (ref 19–32)
CO2: 23 mEq/L (ref 19–32)
CO2: 24 meq/L (ref 19–32)
CO2: 25 meq/L (ref 19–32)
Calcium: 8.7 mg/dL (ref 8.4–10.5)
Calcium: 8.8 mg/dL (ref 8.4–10.5)
Calcium: 8.8 mg/dL (ref 8.4–10.5)
Calcium: 8.8 mg/dL (ref 8.4–10.5)
Calcium: 9.2 mg/dL (ref 8.4–10.5)
Chloride: 100 mEq/L (ref 96–112)
Chloride: 101 mEq/L (ref 96–112)
Chloride: 101 mEq/L (ref 96–112)
Chloride: 99 mEq/L (ref 96–112)
Creatinine, Ser: 0.43 mg/dL — ABNORMAL LOW (ref 0.50–1.10)
Creatinine, Ser: 0.44 mg/dL — ABNORMAL LOW (ref 0.50–1.10)
Creatinine, Ser: 0.44 mg/dL — ABNORMAL LOW (ref 0.50–1.10)
Creatinine, Ser: 0.45 mg/dL — ABNORMAL LOW (ref 0.50–1.10)
Creatinine, Ser: 0.46 mg/dL — ABNORMAL LOW (ref 0.50–1.10)
Creatinine, Ser: 0.49 mg/dL — ABNORMAL LOW (ref 0.50–1.10)
GFR calc Af Amer: >90 mL/min (ref >90)
GFR calc Af Amer: >90 mL/min (ref >90)
GFR calc Af Amer: >90 mL/min (ref >90)
GFR calc Af Amer: >90 mL/min (ref >90)
GFR calc Af Amer: >90 mL/min (ref >90)
GFR calc non Af Amer: >90 mL/min (ref >90)
GFR calc non Af Amer: >90 mL/min (ref >90)
GFR calc non Af Amer: >90 mL/min (ref >90)
GLUCOSE: 345 mg/dL — AB (ref 70–99)
GLUCOSE: 486 mg/dL — AB (ref 70–99)
GLUCOSE: 157 mg/dL — AB (ref 70–99)
GLUCOSE: 255 mg/dL — AB (ref 70–99)
Glucose, Bld: 386 mg/dL — ABNORMAL HIGH (ref 70–99)
Glucose, Bld: 155 mg/dL — ABNORMAL HIGH (ref 70–99)
POTASSIUM: 3.8 meq/L (ref 3.7–5.3)
POTASSIUM: 4.3 meq/L (ref 3.7–5.3)
POTASSIUM: 3.6 meq/L — AB (ref 3.7–5.3)
POTASSIUM: 3.6 meq/L — AB (ref 3.7–5.3)
POTASSIUM: 3.8 meq/L (ref 3.7–5.3)
Potassium: 3.6 mEq/L — ABNORMAL LOW (ref 3.7–5.3)
SODIUM: 137 meq/L (ref 137–147)
SODIUM: 140 meq/L (ref 137–147)
Sodium: 139 mEq/L (ref 137–147)
Sodium: 140 mEq/L (ref 137–147)
Sodium: 136 mEq/L — ABNORMAL LOW (ref 137–147)
Sodium: 134 mEq/L — ABNORMAL LOW (ref 137–147)

## 2013-07-29 LAB — CBC
HEMATOCRIT: 36.7 % (ref 36.0–46.0)
HEMOGLOBIN: 11.5 g/dL — AB (ref 12.0–15.0)
MCH: 24 pg — ABNORMAL LOW (ref 26.0–34.0)
MCHC: 31.3 g/dL (ref 30.0–36.0)
MCV: 76.5 fL — ABNORMAL LOW (ref 78.0–100.0)
Platelets: 377 10*3/uL (ref 150–400)
RBC: 4.8 MIL/uL (ref 3.87–5.11)
RDW: 14.4 % (ref 11.5–15.5)
WBC: 6.3 10*3/uL (ref 4.0–10.5)

## 2013-07-29 LAB — MRSA PCR SCREENING: MRSA BY PCR: NEGATIVE

## 2013-07-29 LAB — GLUCOSE, CAPILLARY
GLUCOSE-CAPILLARY: 109 mg/dL — AB (ref 70–99)
GLUCOSE-CAPILLARY: 183 mg/dL — AB (ref 70–99)
GLUCOSE-CAPILLARY: 225 mg/dL — AB (ref 70–99)
GLUCOSE-CAPILLARY: 236 mg/dL — AB (ref 70–99)
Glucose-Capillary: 118 mg/dL — ABNORMAL HIGH (ref 70–99)
Glucose-Capillary: 280 mg/dL — ABNORMAL HIGH (ref 70–99)
Glucose-Capillary: 291 mg/dL — ABNORMAL HIGH (ref 70–99)
Glucose-Capillary: 295 mg/dL — ABNORMAL HIGH (ref 70–99)
Glucose-Capillary: 307 mg/dL — ABNORMAL HIGH (ref 70–99)
Glucose-Capillary: 477 mg/dL — ABNORMAL HIGH (ref 70–99)

## 2013-07-29 LAB — HEMOGLOBIN A1C
Hgb A1c MFr Bld: 14 % — ABNORMAL HIGH (ref <5.7)
MEAN PLASMA GLUCOSE: 355 mg/dL — AB (ref <117)

## 2013-07-29 MED ORDER — INSULIN ASPART 100 UNIT/ML ~~LOC~~ SOLN
0.0000 [IU] | SUBCUTANEOUS | Status: DC
  Administered 2013-07-29: 11 [IU] via SUBCUTANEOUS
  Administered 2013-07-29: 5 [IU] via SUBCUTANEOUS
  Administered 2013-07-30 (×2): 11 [IU] via SUBCUTANEOUS
  Administered 2013-07-30: 5 [IU] via SUBCUTANEOUS
  Administered 2013-07-30: 3 [IU] via SUBCUTANEOUS

## 2013-07-29 MED ORDER — INSULIN ASPART 100 UNIT/ML ~~LOC~~ SOLN
0.0000 [IU] | Freq: Three times a day (TID) | SUBCUTANEOUS | Status: DC
  Administered 2013-07-29: 8 [IU] via SUBCUTANEOUS

## 2013-07-29 MED ORDER — INSULIN ASPART 100 UNIT/ML ~~LOC~~ SOLN
20.0000 [IU] | Freq: Once | SUBCUTANEOUS | Status: AC
  Administered 2013-07-29: 20 [IU] via SUBCUTANEOUS

## 2013-07-29 MED ORDER — INSULIN GLARGINE 100 UNIT/ML ~~LOC~~ SOLN
25.0000 [IU] | Freq: Two times a day (BID) | SUBCUTANEOUS | Status: DC
  Administered 2013-07-29: 25 [IU] via SUBCUTANEOUS
  Filled 2013-07-29 (×5): qty 0.25

## 2013-07-29 MED ORDER — INSULIN GLARGINE 100 UNIT/ML ~~LOC~~ SOLN
20.0000 [IU] | Freq: Every day | SUBCUTANEOUS | Status: DC
  Administered 2013-07-29: 20 [IU] via SUBCUTANEOUS
  Filled 2013-07-29 (×2): qty 0.2

## 2013-07-29 MED ORDER — INSULIN ASPART 100 UNIT/ML ~~LOC~~ SOLN: 0.0000 [IU] | Freq: Every day | SUBCUTANEOUS | Status: DC

## 2013-07-29 MED ORDER — ALBUTEROL SULFATE (2.5 MG/3ML) 0.083% IN NEBU: 2.5000 mg | INHALATION_SOLUTION | Freq: Four times a day (QID) | RESPIRATORY_TRACT | Status: DC | PRN

## 2013-07-29 NOTE — Progress Notes (Signed)
[facility] PROGRESS NOTE  [name] ZOX:[id number] DOB: 03/22/1995 DOA: 07/28/2013 PCP: Jaclyn ShaggyAMAO, ENOBONG, MD  Assessment/Plan: Principal Problem:   DKA (diabetic ketoacidoses): DKA resolved. Off of insulin drip. Still having markedly high blood sugars, likely as result of getting some basal Lantus, but not as much as her home dose. In addition, D5 left on board for several hours. Now on normal saline, have changed sliding scale the every 4 hours at a CBGs better. Increase Lantus to closer to home dose. Stable. We'll transfer to floor. Unclear etiology what set off this episode of DKA. Active Problems:   Bipolar disorder: Currently stable. Patient is appropriate.    Asthma: Patient breathing comfortably. States that the last few months, her asthma seems to have flared up more often. She has noticed that she is using her when necessary inhaler more often. Discussed this with the patient and suspect seasonal allergies. She said at one time she was on Claritin during the summer which we will start and sent home with prescription for.    Type 1 diabetes mellitus not at goal: Awaiting A1c. Patient states his sugars are not well controlled at home.    Code Status: Full  Family Communication: Tried to call mother, no response  Disposition Plan: Transfer to floor, home when blood sugars are much more stable.   Consultants:  None  Procedures:  None  Antibiotics:  None  HPI/Subjective: Patient doing okay. No complaints. Tolerating solid food.  Objective: Filed Vitals:   07/29/13 1234  BP:   Pulse: 90  Temp: 98.7 F (37.1 C)  Resp: 21    Intake/Output Summary (Last 24 hours) at 07/29/13 1426 Last data filed at 07/29/13 0600  Gross per 24 hour  Intake 704.17 ml  Output    500 ml  Net 204.17 ml   Filed Weights   07/28/13 2304  Weight: 71 kg (156 lb 8.4 oz)    Exam:   General:  Alert and oriented x3, no acute distress  Cardiovascular: Regular rate and rhythm,  S1-S2  Respiratory: Clear to auscultation bilaterally  Abdomen: Soft, nontender, nondistended, positive bowel sounds  Musculoskeletal: No clubbing or cyanosis or edema   Data Reviewed: Basic Metabolic Panel:  Recent Labs Lab 07/28/13 2314 07/29/13 0058 07/29/13 0244 07/29/13 0953 07/29/13 1220  NA 137 140 139  140 136* 134*  K 3.6* 3.8 3.6*  3.6* 3.8 4.3  CL 99 101 104  105 101 100  CO2 20 25 23  24 22 21   GLUCOSE 386* 255* 157*  155* 345* 486*  BUN 6 5* 5*  5* 5* 8  CREATININE 0.45* 0.46* 0.43*  0.44* 0.49* 0.44*  CALCIUM 8.8 9.2 8.8  8.8 9.2 8.7   Liver Function Tests:  Recent Labs Lab 07/28/13 1739  AST 13  ALT 7  ALKPHOS 71  BILITOT 0.3  PROT 8.1  ALBUMIN 3.9    Recent Labs Lab 07/28/13 1739  LIPASE 31   No results found for this basename: AMMONIA,  in the last 168 hours CBC:  Recent Labs Lab 07/28/13 1739 07/29/13 0244  WBC 7.1 6.3  NEUTROABS 4.0  --   HGB 12.3 11.5*  HCT 38.5 36.7  MCV 76.2* 76.5*  PLT 411* 377   Cardiac Enzymes: No results found for this basename: CKTOTAL, CKMB, CKMBINDEX, TROPONINI,  in the last 168 hours BNP (last 3 results) No results found for this basename: PROBNP,  in the last 8760 hours CBG:  Recent Labs Lab 07/29/13 0201 07/29/13 0305  07/29/13 0409 07/29/13 0847 07/29/13 1111  GLUCAP 183* 109* 118* 295* 477*    Recent Results (from the past 240 hour(s))  MRSA PCR SCREENING     Status: None   Collection Time    07/29/13  3:32 AM      Result Value Ref Range Status   MRSA by PCR NEGATIVE  NEGATIVE Final   Comment:            The GeneXpert MRSA Assay (FDA     approved for NASAL specimens     only), is one component of a     comprehensive MRSA colonization     surveillance program. It is not     intended to diagnose MRSA     infection nor to guide or     monitor treatment for     MRSA infections.     Studies: No results found.  Scheduled Meds: . enoxaparin (LOVENOX) injection  40 mg  Subcutaneous Daily  . insulin aspart  0-15 Units Subcutaneous 6 times per day  . insulin glargine  25 Units Subcutaneous BID  . pantoprazole  40 mg Oral Q1200   Continuous Infusions: . sodium chloride 50 mL/hr at 07/29/13 0945    Principal Problem:   DKA (diabetic ketoacidoses) Active Problems:   Bipolar disorder   Asthma   Type 1 diabetes mellitus not at goal    Time spent: 25 minutes    Hollice EspyKRISHNAN,Minnette Merida K  [facility] Pager [phone]. If 7PM-7AM, please contact night-coverage at [email or web address], password [email or web address] 07/29/2013, 2:26 PM  LOS: 1 day

## 2013-07-29 NOTE — Progress Notes (Signed)
Pt transferred the unit at 1700 mental status is alert & oriented x4. Skin is intact. Full assessment charted in [facility]. Call bell within reach. Visitor guidelines reviewed w/ pt and/or family.

## 2013-07-30 LAB — BASIC METABOLIC PANEL
BUN: 9 mg/dL (ref 6–23)
CALCIUM: 8.9 mg/dL (ref 8.4–10.5)
CO2: 22 mEq/L (ref 19–32)
CREATININE: 0.56 mg/dL (ref 0.50–1.10)
Chloride: 98 mEq/L (ref 96–112)
GFR calc Af Amer: >90 mL/min (ref >90)
GFR calc non Af Amer: >90 mL/min (ref >90)
GLUCOSE: 299 mg/dL — AB (ref 70–99)
Potassium: 4.1 mEq/L (ref 3.7–5.3)
SODIUM: 132 meq/L — AB (ref 137–147)

## 2013-07-30 LAB — GLUCOSE, CAPILLARY
GLUCOSE-CAPILLARY: 218 mg/dL — AB (ref 70–99)
GLUCOSE-CAPILLARY: 312 mg/dL — AB (ref 70–99)
Glucose-Capillary: 157 mg/dL — ABNORMAL HIGH (ref 70–99)
Glucose-Capillary: 315 mg/dL — ABNORMAL HIGH (ref 70–99)

## 2013-07-30 MED ORDER — INSULIN ASPART 100 UNIT/ML FLEXPEN: 10.0000 [IU] | PEN_INJECTOR | Freq: Three times a day (TID) | SUBCUTANEOUS | Status: DC

## 2013-07-30 MED ORDER — BLOOD GLUC METER DISP-STRIPS DEVI: Status: AC

## 2013-07-30 MED ORDER — INSULIN GLARGINE 100 UNIT/ML ~~LOC~~ SOLN
30.0000 [IU] | Freq: Two times a day (BID) | SUBCUTANEOUS | Status: DC
  Administered 2013-07-30: 30 [IU] via SUBCUTANEOUS
  Filled 2013-07-30 (×3): qty 0.3

## 2013-07-30 MED ORDER — "PEN NEEDLES 3/16"" 31G X 5 MM MISC": Status: AC

## 2013-07-30 MED ORDER — INSULIN DETEMIR 100 UNIT/ML FLEXPEN
35.0000 [IU] | PEN_INJECTOR | Freq: Two times a day (BID) | SUBCUTANEOUS | Status: DC
Start: 2013-07-30 — End: 2017-05-01

## 2013-07-30 MED ORDER — LORATADINE 10 MG PO TABS: 10.0000 mg | ORAL_TABLET | Freq: Every day | ORAL | Status: DC

## 2013-07-30 NOTE — Care Management Note (Signed)
    Page 1 of 1   07/30/2013     4:08:53 PM CARE MANAGEMENT NOTE 07/30/2013  Patient:  [name],[name]   Account Number:  [id number]  Date Initiated:  07/30/2013  Documentation initiated by:  Letha CapeAYLOR,Allisyn Kunz  Subjective/Objective Assessment:   dx dka  admit- lives with sister     Action/Plan:   Anticipated DC Date:  07/30/2013   Anticipated DC Plan:  HOME/SELF CARE      DC Planning Services  CM consult      Choice offered to / List presented to:             Status of service:  Completed, signed off Medicare Important Message given?   (If response is "NO", the following Medicare IM given date fields will be blank) Date Medicare IM given:   Date Additional Medicare IM given:    Discharge Disposition:  HOME/SELF CARE  Per UR Regulation:  Reviewed for med. necessity/level of care/duration of stay  If discussed at Long Length of Stay Meetings, dates discussed:    Comments:

## 2013-07-30 NOTE — Progress Notes (Signed)
Pt and boyfriend shared that pt is always hungry, and tends to eat "all the time". Attempted to educate both of them on relationship of hunger to lack of insulin for allowing sugar to enter cells, but they were not signaling clear understanding of the relationship or what to do differently. Sounds like they may be counting carbs "per meal" but she may be eating too much food or too many meals overall due to hunger. Will try to reinforce teaching today.

## 2013-07-30 NOTE — Discharge Instructions (Signed)

## 2013-07-30 NOTE — Discharge Summary (Signed)
Physician Discharge Summary  [name] ZOX:[id number] DOB: 1995/12/14 DOA: 07/28/2013  PCP: Arnoldo Morale, MD  Admit date: 07/28/2013 Discharge date: 07/30/2013  Time spent: 25 minutes  Recommendations for Outpatient Follow-up:  1. Patient will follow up with primary care physician in the next 2 weeks 2. Medication change: Levemir increased to 35 units twice a day 3. New Medication: NovoLog 10 units 3 times a day with meals 4. New medication: Claritin 10 mg daily  Discharge Diagnoses:  Principal Problem:   DKA (diabetic ketoacidoses) Active Problems:   Bipolar disorder   Asthma   Type 1 diabetes mellitus not at goal   Discharge Condition: Improved, being discharged home  Diet recommendation: Carb modified  Filed Weights   07/28/13 2304  Weight: 71 kg (156 lb 8.4 oz)    History of present illness:  18 year old African American female past medical history of diabetes type 1 insulin-dependent for at least the past year along with asthma and bipolar disorder who started noticing that her blood sugars were quite high. Difficult to say how often patient checks her blood sugars, there is some question as to adherence to a diet plan. When checking her sugars, noted that they read as high and came to the emergency room on 6/13. Patient found to be in DKA and started on IV fluids plus insulin drip and admitted to the hospitalist service.  Hospital Course:  Principal Problem:   DKA (diabetic ketoacidoses): Overnight, patient's anion gap resolved and patient able to be weaned off of drip and fluids. Diet advanced. See below. Active Problems:   Bipolar disorder: Stable. There are concerns that patient is not able truly understands about her blood sugars, why she should not eat everything all the time. See below.    Asthma: Overall stable during this hospitalization. Patient feels like she is using her when necessary inhaler much for often, especially in the last few months. She previously  several years ago was on Claritin during the summer and suspected likely she is having seasonal allergies which are making her asthma worse. Provided her prescription for Claritin 10 mg daily.    Type 1 diabetes mellitus not at goal: Even with Levemir restarted and close to home dose, patient's sugars still remaining high. No signs of infection. A1c checked and found to be still elevated at 14, despite patient being on 30 units of Levemir twice a day, which is a very high dose given her age and body weight. Diabetes educator met with patient, and has serious concerns about patient's ability to follow instructions for this. The reason she is concerned with increasing the patient's Levemir if further issues on strong doses, however patient has been on this dose reported for some time and her A1c still is an average blood sugar greater than 300. Agree for plan put patient on 10 units of NovoLog 3 times a day. We thought about adding a sliding scale on top of this, but feel the patient would not be very compliant with it. I have also increased her Levemir to 35 units twice a day. I'm concerned that without making some changes, patient will come right back to the hospital in DKA. She needs to be followed closely with her PCP   Procedures:  None  Consultations:  Diabetes educator  Discharge Exam: Filed Vitals:   07/30/13 0424  BP: 96/61  Pulse: 78  Temp: 98.3 F (36.8 C)  Resp: 18    General: Alert and oriented x3, no acute distress Cardiovascular: Regular rate  and rhythm, S1-S2 Respiratory: Clear to auscultation bilaterally  Discharge Instructions You were cared for by a hospitalist during your hospital stay. If you have any questions about your discharge medications or the care you received while you were in the hospital after you are discharged, you can call the unit and asked to speak with the hospitalist on call if the hospitalist that took care of you is not available. Once you are  discharged, your primary care physician will handle any further medical issues. Please note that NO REFILLS for any discharge medications will be authorized once you are discharged, as it is imperative that you return to your primary care physician (or establish a relationship with a primary care physician if you do not have one) for your aftercare needs so that they can reassess your need for medications and monitor your lab values.  Discharge Instructions   Diet Carb Modified    Complete by:  As directed      Increase activity slowly    Complete by:  As directed             Medication List         albuterol 108 (90 BASE) MCG/ACT inhaler  Commonly known as:  PROVENTIL HFA;VENTOLIN HFA  Inhale 2 puffs into the lungs every 6 (six) hours as needed for wheezing or shortness of breath.     BLOOD GLUCOSE METER DISPOSABLE Devi  Use as directed     Insulin Detemir 100 UNIT/ML Pen  Commonly known as:  LEVEMIR FLEXPEN  Inject 35 Units into the skin 2 (two) times daily. 10am and 10pm     Insulin Pen Needle 31G X 5 MM Misc  Use with insulin pen     Pen Needles 3/16" 31G X 5 MM Misc  Use as directed     loratadine 10 MG tablet  Commonly known as:  CLARITIN  Take 1 tablet (10 mg total) by mouth daily.     NOVOLOG FLEXPEN 100 UNIT/ML FlexPen  Generic drug:  insulin aspart  Inject 3-8 Units into the skin 3 (three) times daily with meals. Per sliding scale     insulin aspart 100 UNIT/ML FlexPen  Commonly known as:  NOVOLOG  Inject 10 Units into the skin 3 (three) times daily with meals.       No Known Allergies     Follow-up Information   Follow up with Arnoldo Morale, MD In 2 weeks.   Specialty:  Family Medicine   Contact information:   [address] [place] [address] [phone]        The results of significant diagnostics from this hospitalization (including imaging, microbiology, ancillary and laboratory) are listed below for reference.    Significant  Diagnostic Studies: No results found.  Microbiology: Recent Results (from the past 240 hour(s))  MRSA PCR SCREENING     Status: None   Collection Time    07/29/13  3:32 AM      Result Value Ref Range Status   MRSA by PCR NEGATIVE  NEGATIVE Final   Comment:            The GeneXpert MRSA Assay (FDA     approved for NASAL specimens     only), is one component of a     comprehensive MRSA colonization     surveillance program. It is not     intended to diagnose MRSA     infection nor to guide or  monitor treatment for     MRSA infections.     Labs: Basic Metabolic Panel:  Recent Labs Lab 07/28/13 2314 07/29/13 0058 07/29/13 0244 07/29/13 0953 07/29/13 1220  NA 137 140 139  140 136* 134*  K 3.6* 3.8 3.6*  3.6* 3.8 4.3  CL 99 101 104  105 101 100  CO2 _0 GLUCOSE 386* 255* 157*  155* 345* 486*  BUN 6 5* 5*  5* 5* 8  CREATININE 0.45* 0.46* 0.43*  0.44* 0.49* 0.44*  CALCIUM 8.8 9.2 8.8  8.8 9.2 8.7   Liver Function Tests:  Recent Labs Lab 07/28/13 1739  AST 13  ALT 7  ALKPHOS 71  BILITOT 0.3  PROT 8.1  ALBUMIN 3.9    Recent Labs Lab 07/28/13 1739  LIPASE 31   No results found for this basename: AMMONIA,  in the last 168 hours CBC:  Recent Labs Lab 07/28/13 1739 07/29/13 0244  WBC 7.1 6.3  NEUTROABS 4.0  --   HGB 12.3 11.5*  HCT 38.5 36.7  MCV 76.2* 76.5*  PLT 411* 377   Cardiac Enzymes: No results found for this basename: CKTOTAL, CKMB, CKMBINDEX, TROPONINI,  in the last 168 hours BNP: BNP (last 3 results) No results found for this basename: PROBNP,  in the last 8760 hours CBG:  Recent Labs Lab 07/29/13 2048 07/30/13 0025 07/30/13 0410 07/30/13 0847 07/30/13 1156  GLUCAP 236* 315* 218* 157* 312*       Signed:  Achaia Garlock K  [facility] 07/30/2013, 1:56 PM

## 2013-07-30 NOTE — Progress Notes (Signed)
Patient discharge teaching given, including activity, diet, follow-up appoints, and medications. Patient verbalized understanding of all discharge instructions. IV access was d/c'd. Vitals are stable. Skin is intact except as charted in most recent assessments. Pt to be escorted out by NT, to be driven home by boyfriend.

## 2013-07-30 NOTE — Progress Notes (Signed)
Inpatient Diabetes Program Recommendations  AACE/ADA: New Consensus Statement on Inpatient Glycemic Control (2013)  Target Ranges:  Prepandial:   less than 140 mg/dL      Peak postprandial:   less than 180 mg/dL (1-2 hours)      Critically ill patients:  140 - 180 mg/dL     Results for Hilbert OdorGOMEZ, Tanea (MRN [id number]) as of 07/30/2013 13:32  Ref. Range 07/30/2013 00:25 07/30/2013 04:10 07/30/2013 08:47 07/30/2013 11:56  Glucose-Capillary Latest Range: 70-99 mg/dL 409315 (H) 811218 (H) 914157 (H) 312 (H)    Results for Hilbert OdorGOMEZ, Alys (MRN [id number]) as of 07/30/2013 13:32  Ref. Range 07/28/2013 23:14  Hemoglobin A1C Latest Range: <5.7 % 14.0 (H)     Patient admitted with DKA.  Glucose 596 mg/dl.  Has history of Type 1 DM that was diagnosed by Dr. Fransico MichaelBrennan back in January of 2013.  Home DM Meds: Levemir 30 units bid + Novolog   Spoke with patient about her home DM care regimen.  Patient appeared to be listening to me, however, I am unsure the information patient shared with me was truthful and accurate.  Patient's boyfriend was at bedside during my visit with patient and interrupted me several times while I was asking patient questions.  Patient's boyfriend seemed as if he was trying to dominate our conversation.  Patient and her boyfriend told me that patient wanted to start seeing Dr. Fransico MichaelBrennan again.  Patient was unsure of the last time she saw Dr. Fransico MichaelBrennan as an outpatient.  She is [facility] [place], however, I am unsure how many times patient has seen Dr. Welford RochePhadke with [facility] over the last year.  Per Chart Review, patient's mother requested that patient be transferred to [facility] July of [facility], however, now patient is telling me that she wants to go back to see Dr. Fransico MichaelBrennan.    Patient also told me she used to dose her Novolog insulin through carbohydrate counting and was familiar with how to carb count.  Stated she "lost her calorie king carb  counting book" and asked me if I had another one.  I do not have another Calorie Brooke DareKing book to give to patient, however, I was able to provide patient with a simpler carbohydrate counting guide.  Patient stated to me that she knows how to carb count and used to give her Novolog on a 1 unit for every 15 grams carbs basis.  Explained to patient that since she has Type 1 diabetes, she needs basal insulin to cover her fasting needs, carbohydrate coverage to cover her food intake, and correction insulin to correct her high blood sugars.  Patient stated she already knows this and just needs someone to give her the correct insulin regimen to use at home.  Patient also stated to me that she had been through extensive diabetes education at Dr. Juluis MireBrennan's office and that she just needs someone to clarify her insulin regimen.  Called Dr. Rito EhrlichKrishnan to discuss this patient.  Relayed all of the above information.  Dr. Rito EhrlichKrishnan decided to send patient home on Novolog 10 units tid with meals + her home dose of Levemir.  Patient requested Rxs for CBG meter strips and pen needles.   Will follow Ambrose FinlandJeannine Johnston Indalecio Malmstrom RN, MSN, CDE Diabetes Coordinator Inpatient Diabetes Program Team Pager: [phone] (8a-10p)

## 2013-08-02 NOTE — ED Provider Notes (Signed)
Medical screening examination/treatment/procedure(s) were performed by non-physician practitioner and as supervising physician I was immediately available for consultation/collaboration.   EKG Interpretation None        Richardean Canalavid H Fredy Gladu, MD 08/02/13 [phone]

## 2015-07-01 ENCOUNTER — Encounter (HOSPITAL_COMMUNITY): Payer: Self-pay | Admitting: *Deleted

## 2015-07-01 ENCOUNTER — Emergency Department (HOSPITAL_COMMUNITY)
Admission: EM | Admit: 2015-07-01 | Discharge: 2015-07-01 | Disposition: A | Discharge status: Home / Self Care | Payer: Medicaid Other | Attending: Emergency Medicine | Admitting: Emergency Medicine

## 2015-07-01 DIAGNOSIS — Z8659 Personal history of other mental and behavioral disorders: Secondary | ICD-10-CM | POA: Insufficient documentation

## 2015-07-01 DIAGNOSIS — Z79899 Other long term (current) drug therapy: Secondary | ICD-10-CM | POA: Insufficient documentation

## 2015-07-01 DIAGNOSIS — Z794 Long term (current) use of insulin: Secondary | ICD-10-CM | POA: Diagnosis not present

## 2015-07-01 DIAGNOSIS — Z973 Presence of spectacles and contact lenses: Secondary | ICD-10-CM | POA: Diagnosis not present

## 2015-07-01 DIAGNOSIS — J45909 Unspecified asthma, uncomplicated: Secondary | ICD-10-CM | POA: Insufficient documentation

## 2015-07-01 DIAGNOSIS — Z87440 Personal history of urinary (tract) infections: Secondary | ICD-10-CM | POA: Diagnosis not present

## 2015-07-01 DIAGNOSIS — Z8669 Personal history of other diseases of the nervous system and sense organs: Secondary | ICD-10-CM | POA: Insufficient documentation

## 2015-07-01 DIAGNOSIS — R739 Hyperglycemia, unspecified: Secondary | ICD-10-CM

## 2015-07-01 DIAGNOSIS — Z3202 Encounter for pregnancy test, result negative: Secondary | ICD-10-CM | POA: Diagnosis not present

## 2015-07-01 DIAGNOSIS — E1065 Type 1 diabetes mellitus with hyperglycemia: Secondary | ICD-10-CM | POA: Insufficient documentation

## 2015-07-01 LAB — CBC
HCT: 37.7 % (ref 36.0–46.0)
Hemoglobin: 12.2 g/dL (ref 12.0–15.0)
MCH: 25.3 pg — AB (ref 26.0–34.0)
MCHC: 32.4 g/dL (ref 30.0–36.0)
MCV: 78.1 fL (ref 78.0–100.0)
PLATELETS: 336 10*3/uL (ref 150–400)
RBC: 4.83 MIL/uL (ref 3.87–5.11)
RDW: 13.8 % (ref 11.5–15.5)
WBC: 6.8 10*3/uL (ref 4.0–10.5)

## 2015-07-01 LAB — BASIC METABOLIC PANEL
Anion gap: 13 (ref 5–15)
BUN: 8 mg/dL (ref 6–20)
CHLORIDE: 98 mmol/L — AB (ref 101–111)
CO2: 24 mmol/L (ref 22–32)
CREATININE: 0.8 mg/dL (ref 0.44–1.00)
Calcium: 9.5 mg/dL (ref 8.9–10.3)
GFR calc Af Amer: >60 mL/min (ref >60)
GFR calc non Af Amer: >60 mL/min (ref >60)
Glucose, Bld: 309 mg/dL — ABNORMAL HIGH (ref 65–99)
Potassium: 4.1 mmol/L (ref 3.5–5.1)
SODIUM: 135 mmol/L (ref 135–145)

## 2015-07-01 LAB — URINALYSIS, ROUTINE W REFLEX MICROSCOPIC
Bilirubin Urine: NEGATIVE
HGB URINE DIPSTICK: NEGATIVE
KETONES UR: 40 mg/dL — AB
Leukocytes, UA: NEGATIVE
Nitrite: NEGATIVE
Protein, ur: NEGATIVE mg/dL
Specific Gravity, Urine: 1.02 (ref 1.005–1.030)
pH: 6 (ref 5.0–8.0)

## 2015-07-01 LAB — URINE MICROSCOPIC-ADD ON: RBC / HPF: NONE SEEN RBC/hpf (ref 0–5)

## 2015-07-01 LAB — CBG MONITORING, ED: Glucose-Capillary: 311 mg/dL — ABNORMAL HIGH (ref 65–99)

## 2015-07-01 LAB — PREGNANCY, URINE: PREG TEST UR: NEGATIVE

## 2015-07-01 MED ORDER — SODIUM CHLORIDE 0.9 % IV BOLUS (SEPSIS)
1000.0000 mL | Freq: Once | INTRAVENOUS | Status: AC
  Administered 2015-07-01: 1000 mL via INTRAVENOUS

## 2015-07-01 NOTE — ED Provider Notes (Signed)
CSN: [id number]     Arrival date & time 07/01/15  0815 History   First MD Initiated Contact with Patient 07/01/15 [phone]     Chief Complaint  Patient presents with  . Hyperglycemia     (Consider location/radiation/quality/duration/timing/severity/associated sxs/prior Treatment) HPI Comments: 20 y.o. Female with history of asthma, Type I DM presents for elevated blood glucose.  The patient states that over the last 3 days or so she has had difficulty controlling her blood sugar and that it was reading high this morning.  She reports taking her insulin this morning and taking additional insulin to try to help control it better.  She states that she has a new endocrinologist who is somewhat helping with her blood sugar control but states that her primary care physician does not see eye to eye with her.  She denies illness.  She has been urinating frequently.  No nausea, vomiting, diarrhea, abdominal pain.  Patient is a 20 y.o. female presenting with hyperglycemia.  Hyperglycemia Associated symptoms: increased thirst and polyuria   Associated symptoms: no abdominal pain, no chest pain, no diaphoresis, no dizziness, no dysuria, no fatigue, no fever, no nausea, no shortness of breath, no vomiting and no weakness     Past Medical History  Diagnosis Date  . Allergy     dust   . Anxiety   . Asthma   . Diabetes mellitus   . Urinary tract infection   . Vision abnormalities     wears glasses  . Learning disability    Past Surgical History  Procedure Laterality Date  . No past surgeries     Family History  Problem Relation Age of Onset  . Adopted: Yes  . Early death Sister   . Asthma Maternal Aunt   . Depression Maternal Aunt   . Mental illness Maternal Aunt   . Asthma Maternal Grandmother   . Thyroid disease Maternal Grandmother   . Obesity Maternal Grandmother   . Heart disease Neg Hx   . Diabetes Neg Hx    Social History  Substance Use Topics  . Smoking status: Never Smoker   .  Smokeless tobacco: Never Used  . Alcohol Use: No   OB History    No data available     Review of Systems  Constitutional: Negative for fever, diaphoresis, appetite change and fatigue.  HENT: Negative for congestion, rhinorrhea and sinus pressure.   Eyes: Negative for visual disturbance.  Respiratory: Negative for cough, chest tightness and shortness of breath.   Cardiovascular: Negative for chest pain and palpitations.  Gastrointestinal: Negative for nausea, vomiting, abdominal pain and diarrhea.  Endocrine: Positive for polydipsia and polyuria.  Genitourinary: Negative for dysuria, urgency and frequency.  Musculoskeletal: Negative for myalgias and back pain.  Skin: Negative for rash.  Neurological: Negative for dizziness, seizures, weakness, numbness and headaches.  Hematological: Does not bruise/bleed easily.      Allergies  Review of patient's allergies indicates no known allergies.  Home Medications   Prior to Admission medications   Medication Sig Start Date End Date Taking? Authorizing Provider  albuterol (PROVENTIL HFA;VENTOLIN HFA) 108 (90 BASE) MCG/ACT inhaler Inhale 2 puffs into the lungs every 6 (six) hours as needed for wheezing or shortness of breath.    Yes Historical Provider, MD  insulin aspart protamine - aspart (NOVOLOG MIX 70/30 FLEXPEN) (70-30) 100 UNIT/ML FlexPen Inject 10 Units into the skin 3 (three) times daily. 01/16/15  Yes Historical Provider, MD  Insulin Pen Needle (PEN NEEDLES 3/16") 31G  X 5 MM MISC Use as directed 07/30/13  Yes Hollice EspySendil K Krishnan, MD  Insulin Pen Needle 31G X 5 MM MISC Use with insulin pen 08/17/12  Yes David StallMichael J Brennan, MD  pravastatin (PRAVACHOL) 20 MG tablet Take 20 mg by mouth daily.   Yes Historical Provider, MD  Blood Gluc Meter Disp-Strips (BLOOD GLUCOSE METER DISPOSABLE) DEVI Use as directed Patient not taking: Reported on 07/01/2015 07/30/13   Hollice EspySendil K Krishnan, MD  insulin aspart (NOVOLOG) 100 UNIT/ML FlexPen Inject 10 Units  into the skin 3 (three) times daily with meals. Patient not taking: Reported on 07/01/2015 07/30/13   Hollice EspySendil K Krishnan, MD  Insulin Detemir (LEVEMIR FLEXPEN) 100 UNIT/ML Pen Inject 35 Units into the skin 2 (two) times daily. 10am and 10pm Patient not taking: Reported on 07/01/2015 07/30/13   Hollice EspySendil K Krishnan, MD  loratadine (CLARITIN) 10 MG tablet Take 1 tablet (10 mg total) by mouth daily. Patient not taking: Reported on 07/01/2015 07/30/13   Hollice EspySendil K Krishnan, MD   BP 115/73 mmHg  Pulse 80  Temp(Src) 98.3 F (36.8 C) (Oral)  Resp 14  Wt 154 lb 9 oz (70.109 kg)  SpO2 100%  LMP 06/16/2015 Physical Exam  Constitutional: She is oriented to person, place, and time. She appears well-developed and well-nourished. No distress.  HENT:  Head: Normocephalic and atraumatic.  Right Ear: External ear normal.  Left Ear: External ear normal.  Nose: Nose normal.  Mouth/Throat: Oropharynx is clear and moist. No oropharyngeal exudate.  Eyes: EOM are normal. Pupils are equal, round, and reactive to light.  Neck: Normal range of motion. Neck supple.  Cardiovascular: Normal rate, regular rhythm, normal heart sounds and intact distal pulses.   No murmur heard. Pulmonary/Chest: Effort normal. No respiratory distress. She has no wheezes. She has no rales.  Abdominal: Soft. She exhibits no distension. There is no tenderness.  Musculoskeletal: Normal range of motion. She exhibits no edema or tenderness.  Neurological: She is alert and oriented to person, place, and time.  Skin: Skin is warm and dry. No rash noted. She is not diaphoretic.  Vitals reviewed.   ED Course  Procedures (including critical care time) Labs Review Labs Reviewed  BASIC METABOLIC PANEL - Abnormal; Notable for the following:    Chloride 98 (*)    Glucose, Bld 309 (*)    All other components within normal limits  CBC - Abnormal; Notable for the following:    MCH 25.3 (*)    All other components within normal limits  URINALYSIS,  ROUTINE W REFLEX MICROSCOPIC (NOT AT [facility]) - Abnormal; Notable for the following:    Glucose, UA >1000 (*)    Ketones, ur 40 (*)    All other components within normal limits  URINE MICROSCOPIC-ADD ON - Abnormal; Notable for the following:    Squamous Epithelial / LPF 0-5 (*)    Bacteria, UA RARE (*)    All other components within normal limits  CBG MONITORING, ED - Abnormal; Notable for the following:    Glucose-Capillary 311 (*)    All other components within normal limits  PREGNANCY, URINE  CBG MONITORING, ED    Imaging Review No results found. I have personally reviewed and evaluated these images and lab results as part of my medical decision-making.   EKG Interpretation None      MDM  Patient seen and evaluated in stable condition.  Patient hyperglycemic.  Small amount of ketones normal bicarb, no anion gap.  Patient well appearing.  Glucose  improved with IV fluid bolus.  Discussed results with patient and friend.  Patient reported she could call her endocrinologist regarding insulin adjustments.  She expressed understanding and agreement with plan for discharge.  Strict return precautions given.  Patient discharged in stable condition.   Final diagnoses:  Hyperglycemia    1. Hyperglycemia    Leta Baptist, MD 07/01/15 2134

## 2015-07-01 NOTE — Discharge Instructions (Signed)
You were seen and evaluated today for elevated blood sugar. At this time acid is not building up in your blood stream but this can occur if your sugar does not get under better control. Please call your endocrinologist to let them know he came in to the emergency department and for further instruction on your insulin.  Hyperglycemia Hyperglycemia occurs when the glucose (sugar) in your blood is too high. Hyperglycemia can happen for many reasons, but it most often happens to people who do not know they have diabetes or are not managing their diabetes properly.  CAUSES  Whether you have diabetes or not, there are other causes of hyperglycemia. Hyperglycemia can occur when you have diabetes, but it can also occur in other situations that you might not be as aware of, such as: Diabetes  If you have diabetes and are having problems controlling your blood glucose, hyperglycemia could occur because of some of the following reasons:  Not following your meal plan.  Not taking your diabetes medications or not taking it properly.  Exercising less or doing less activity than you normally do.  Being sick. Pre-diabetes  This cannot be ignored. Before people develop Type 2 diabetes, they almost always have "pre-diabetes." This is when your blood glucose levels are higher than normal, but not yet high enough to be diagnosed as diabetes. Research has shown that some long-term damage to the body, especially the heart and circulatory system, may already be occurring during pre-diabetes. If you take action to manage your blood glucose when you have pre-diabetes, you may delay or prevent Type 2 diabetes from developing. Stress  If you have diabetes, you may be "diet" controlled or on oral medications or insulin to control your diabetes. However, you may find that your blood glucose is higher than usual in the hospital whether you have diabetes or not. This is often referred to as "stress hyperglycemia." Stress can  elevate your blood glucose. This happens because of hormones put out by the body during times of stress. If stress has been the cause of your high blood glucose, it can be followed regularly by your caregiver. That way he/she can make sure your hyperglycemia does not continue to get worse or progress to diabetes. Steroids  Steroids are medications that act on the infection fighting system (immune system) to block inflammation or infection. One side effect can be a rise in blood glucose. Most people can produce enough extra insulin to allow for this rise, but for those who cannot, steroids make blood glucose levels go even higher. It is not unusual for steroid treatments to "uncover" diabetes that is developing. It is not always possible to determine if the hyperglycemia will go away after the steroids are stopped. A special blood test called an A1c is sometimes done to determine if your blood glucose was elevated before the steroids were started. SYMPTOMS  Thirsty.  Frequent urination.  Dry mouth.  Blurred vision.  Tired or fatigue.  Weakness.  Sleepy.  Tingling in feet or leg. DIAGNOSIS  Diagnosis is made by monitoring blood glucose in one or all of the following ways:  A1c test. This is a chemical found in your blood.  Fingerstick blood glucose monitoring.  Laboratory results. TREATMENT  First, knowing the cause of the hyperglycemia is important before the hyperglycemia can be treated. Treatment may include, but is not be limited to:  Education.  Change or adjustment in medications.  Change or adjustment in meal plan.  Treatment for an illness,  infection, etc.  More frequent blood glucose monitoring.  Change in exercise plan.  Decreasing or stopping steroids.  Lifestyle changes. HOME CARE INSTRUCTIONS   Test your blood glucose as directed.  Exercise regularly. Your caregiver will give you instructions about exercise. Pre-diabetes or diabetes which comes on with  stress is helped by exercising.  Eat wholesome, balanced meals. Eat often and at regular, fixed times. Your caregiver or nutritionist will give you a meal plan to guide your sugar intake.  Being at an ideal weight is important. If needed, losing as little as 10 to 15 pounds may help improve blood glucose levels. SEEK MEDICAL CARE IF:   You have questions about medicine, activity, or diet.  You continue to have symptoms (problems such as increased thirst, urination, or weight gain). SEEK IMMEDIATE MEDICAL CARE IF:   You are vomiting or have diarrhea.  Your breath smells fruity.  You are breathing faster or slower.  You are very sleepy or incoherent.  You have numbness, tingling, or pain in your feet or hands.  You have chest pain.  Your symptoms get worse even though you have been following your caregiver's orders.  If you have any other questions or concerns.   This information is not intended to replace advice given to you by your health care provider. Make sure you discuss any questions you have with your health care provider.   Document Released: 07/28/2000 Document Revised: 04/26/2011 Document Reviewed: 10/08/2014 Elsevier Interactive Patient Education [employer]2016 [employer] Inc.

## 2015-07-01 NOTE — ED Notes (Signed)
CBG 243 

## 2015-07-01 NOTE — ED Notes (Signed)
Pt from home with c/o high blood sugar, polydipsia, polyuria and blurred vision x3 days. Pt type 1 diabetic, hx of DKA x2. Denies pain or sob but has some nausea.

## 2015-07-01 NOTE — ED Notes (Signed)
Pt in from home c/o checking BS that reads high, pt has Type 1 DM, pt reports taking 70/30 insulin as prescribed, pt took 30 units today, pt denies n/v & SOB, pt c/o polyuria & polydipsia, pt A&O x4

## 2015-07-02 LAB — CBG MONITORING, ED: Glucose-Capillary: 243 mg/dL — ABNORMAL HIGH (ref 65–99)

## 2017-03-25 ENCOUNTER — Encounter (HOSPITAL_COMMUNITY): Payer: Self-pay | Admitting: Emergency Medicine

## 2017-03-25 ENCOUNTER — Emergency Department (HOSPITAL_COMMUNITY): Payer: Medicaid Other

## 2017-03-25 ENCOUNTER — Other Ambulatory Visit: Payer: Self-pay

## 2017-03-25 DIAGNOSIS — J45909 Unspecified asthma, uncomplicated: Secondary | ICD-10-CM | POA: Diagnosis not present

## 2017-03-25 DIAGNOSIS — Z79899 Other long term (current) drug therapy: Secondary | ICD-10-CM | POA: Insufficient documentation

## 2017-03-25 DIAGNOSIS — F17210 Nicotine dependence, cigarettes, uncomplicated: Secondary | ICD-10-CM | POA: Insufficient documentation

## 2017-03-25 DIAGNOSIS — E109 Type 1 diabetes mellitus without complications: Secondary | ICD-10-CM | POA: Diagnosis not present

## 2017-03-25 DIAGNOSIS — J069 Acute upper respiratory infection, unspecified: Secondary | ICD-10-CM | POA: Insufficient documentation

## 2017-03-25 DIAGNOSIS — Z794 Long term (current) use of insulin: Secondary | ICD-10-CM | POA: Insufficient documentation

## 2017-03-25 DIAGNOSIS — R0981 Nasal congestion: Secondary | ICD-10-CM | POA: Diagnosis present

## 2017-03-25 LAB — URINALYSIS, ROUTINE W REFLEX MICROSCOPIC
BILIRUBIN URINE: NEGATIVE
Bacteria, UA: NONE SEEN
Glucose, UA: >=500 mg/dL — AB
HGB URINE DIPSTICK: NEGATIVE
Ketones, ur: NEGATIVE mg/dL
NITRITE: NEGATIVE
PROTEIN: NEGATIVE mg/dL
Specific Gravity, Urine: 1.029 (ref 1.005–1.030)
pH: 7 (ref 5.0–8.0)

## 2017-03-25 LAB — CBG MONITORING, ED: GLUCOSE-CAPILLARY: 431 mg/dL — AB (ref 65–99)

## 2017-03-25 LAB — I-STAT BETA HCG BLOOD, ED (MC, WL, AP ONLY)

## 2017-03-25 NOTE — ED Triage Notes (Signed)
Pt reports cold like S/S since yesterday with low grade fever at home (highest 100.4) Pt reports congestion, body aches, sneezing, runny nose. Pt states she is diabetic and sugars have been running in 300's. Pt took 6 units insulin around 1700.

## 2017-03-26 ENCOUNTER — Emergency Department (HOSPITAL_COMMUNITY)
Admission: EM | Admit: 2017-03-26 | Discharge: 2017-03-26 | Disposition: A | Discharge status: Home / Self Care | Payer: Medicaid Other | Attending: Emergency Medicine | Admitting: Emergency Medicine

## 2017-03-26 DIAGNOSIS — J069 Acute upper respiratory infection, unspecified: Secondary | ICD-10-CM

## 2017-03-26 HISTORY — DX: Systemic lupus erythematosus, unspecified: M32.9

## 2017-03-26 HISTORY — DX: Reserved for concepts with insufficient information to code with codable children: IMO0002

## 2017-03-26 LAB — BASIC METABOLIC PANEL
ANION GAP: 10 (ref 5–15)
BUN: 7 mg/dL (ref 6–20)
CALCIUM: 9.4 mg/dL (ref 8.9–10.3)
CO2: 21 mmol/L — AB (ref 22–32)
Chloride: 103 mmol/L (ref 101–111)
Creatinine, Ser: 0.66 mg/dL (ref 0.44–1.00)
Glucose, Bld: 478 mg/dL — ABNORMAL HIGH (ref 65–99)
Potassium: 4 mmol/L (ref 3.5–5.1)
Sodium: 134 mmol/L — ABNORMAL LOW (ref 135–145)

## 2017-03-26 LAB — CBC
HEMATOCRIT: 36 % (ref 36.0–46.0)
Hemoglobin: 11.6 g/dL — ABNORMAL LOW (ref 12.0–15.0)
MCH: 26.1 pg (ref 26.0–34.0)
MCHC: 32.2 g/dL (ref 30.0–36.0)
MCV: 81.1 fL (ref 78.0–100.0)
Platelets: 311 10*3/uL (ref 150–400)
RBC: 4.44 MIL/uL (ref 3.87–5.11)
RDW: 13.6 % (ref 11.5–15.5)
WBC: 5.7 10*3/uL (ref 4.0–10.5)

## 2017-03-26 LAB — CBG MONITORING, ED: Glucose-Capillary: 380 mg/dL — ABNORMAL HIGH (ref 65–99)

## 2017-03-26 MED ORDER — BENZONATATE 100 MG PO CAPS: 200.0000 mg | ORAL_CAPSULE | Freq: Two times a day (BID) | ORAL | 0 refills | Status: DC | PRN

## 2017-03-26 NOTE — ED Provider Notes (Signed)
[facility] EMERGENCY DEPARTMENT Provider Note   CSN: [id number] Arrival date & time: 03/25/17  2321     History   Chief Complaint Chief Complaint  Patient presents with  . Nasal Congestion    HPI [name] is a 22 y.o. female.  Patient presents to the emergency department with a chief complaint of sneezing.  She reports sneezing that started yesterday.  She also reports rhinorrhea, nasal congestion, and low-grade fever at home.  She denies sore throat or cough.  She states that she may have had some mild body aches, but does not ache now.  She denies having taken anything for symptoms.  She is type I diabetic and has an insulin pump.  She has been adjusting this because her blood sugar has been running high.   The history is provided by the patient. No language interpreter was used.    Past Medical History:  Diagnosis Date  . Allergy    dust   . Anxiety   . Asthma   . Diabetes mellitus   . Learning disability   . Lupus   . Urinary tract infection   . Vision abnormalities    wears glasses    Patient Active Problem List   Diagnosis Date Noted  . DKA (diabetic ketoacidoses) (HCC) 07/28/2013  . Acute respiratory failure with hypoxia (HCC) 08/13/2012  . Hypoglycemia associated with diabetes (HCC) 04/13/2012  . 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 Arrhythmia 04/13/2012  . Depression 04/13/2012  . Diabetic peripheral neuropathy (HCC) 04/13/2012  . Diabetic hyperosmolar non-ketotic state (HCC) 04/04/2012  . Hyponatremia 04/04/2012  . Trichomoniasis 03/23/2012  . Hydradenitis 03/23/2012  . Hyperglycemia without ketosis 03/21/2012  . Short-term memory loss 04/30/2011  . 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 Type 1 diabetes mellitus not at goal [facility]) 03/24/2011  . Insulin resistance 03/24/2011  . Acanthosis 03/24/2011  . Goiter 03/24/2011  . Xerosis cutis 02/25/2011    . Hypoalbuminemia 02/24/2011  . Edema of both legs 02/24/2011  . Eczema 02/24/2011  . Learning disability   . Folliculitis 02/13/2011  . Herpes genitalis 02/13/2011  . Bladder spasm 02/07/2011  . Bipolar disorder (HCC) 02/07/2011    Class: Chronic  . Altered mental status 02/07/2011  . Asthma 02/07/2011    Class: Chronic    Past Surgical History:  Procedure Laterality Date  . NO PAST SURGERIES      OB History    No data available       Home Medications    Prior to Admission medications   Medication Sig Start Date End Date Taking? Authorizing Provider  Blood Gluc Meter Disp-Strips (BLOOD GLUCOSE METER DISPOSABLE) DEVI Use as directed Patient not taking: Reported on 07/01/2015 07/30/13   Hollice Espy, MD  insulin aspart (NOVOLOG) 100 UNIT/ML FlexPen Inject 10 Units into the skin 3 (three) times daily with meals. Patient not taking: Reported on 07/01/2015 07/30/13   Hollice Espy, MD  Insulin Detemir (LEVEMIR FLEXPEN) 100 UNIT/ML Pen Inject 35 Units into the skin 2 (two) times daily. 10am and 10pm Patient not taking: Reported on 07/01/2015 07/30/13   Hollice Espy, MD  Insulin Pen Needle (PEN NEEDLES 3/16") 31G X 5 MM MISC Use as directed 07/30/13   Hollice Espy, MD  Insulin Pen Needle 31G X 5 MM MISC Use with insulin pen 08/17/12   David Stall, MD  loratadine (CLARITIN) 10 MG tablet Take 1 tablet (  10 mg total) by mouth daily. Patient not taking: Reported on 07/01/2015 07/30/13   Hollice Espy, MD    Family History Family History  Adopted: Yes  Problem Relation Age of Onset  . Early death Sister   . Asthma Maternal Aunt   . Depression Maternal Aunt   . Mental illness Maternal Aunt   . Asthma Maternal Grandmother   . Thyroid disease Maternal Grandmother   . Obesity Maternal Grandmother   . Heart disease Neg Hx   . Diabetes Neg Hx     Social History Social History   Tobacco Use  . Smoking status: Current Every Day Smoker    Packs/day:  0.25    Types: Cigarettes  . Smokeless tobacco: Never Used  Substance Use Topics  . Alcohol use: No  . Drug use: No     Allergies   Patient has no known allergies.   Review of Systems Review of Systems  All other systems reviewed and are negative.    Physical Exam Updated Vital Signs BP (!) 105/56 (BP Location: Right Arm)   Pulse 75   Temp 97.8 F (36.6 C) (Oral)   Resp 16   Ht 5\' 4"  (1.626 m)   Wt 72.6 kg (160 lb)   LMP 03/09/2017   SpO2 99%   BMI 27.46 kg/m   Physical Exam Physical Exam  Constitutional: Pt  is oriented to person, place, and time. Appears well-developed and well-nourished. No distress.  HENT:  Head: Normocephalic and atraumatic.  Right Ear: Tympanic membrane, external ear and ear canal normal.  Left Ear: Tympanic membrane, external ear and ear canal normal.  Nose: Mucosal edema and mild rhinorrhea present. No epistaxis. Right sinus exhibits no maxillary sinus tenderness and no frontal sinus tenderness. Left sinus exhibits no maxillary sinus tenderness and no frontal sinus tenderness.  Mouth/Throat: Uvula is midline and mucous membranes are normal. Mucous membranes are not pale and not cyanotic. No oropharyngeal exudate, posterior oropharyngeal edema, posterior oropharyngeal erythema or tonsillar abscesses.  Eyes: Conjunctivae are normal. Pupils are equal, round, and reactive to light.  Neck: Normal range of motion and full passive range of motion without pain.  Cardiovascular: Normal rate and intact distal pulses.   Pulmonary/Chest: Effort normal and breath sounds normal. No stridor.  Clear and equal breath sounds without focal wheezes, rhonchi, rales  Abdominal: Soft. Bowel sounds are normal. There is no tenderness.  Musculoskeletal: Normal range of motion.  Lymphadenopathy:    Pthas no cervical adenopathy.  Neurological: Pt is alert and oriented to person, place, and time.  Skin: Skin is warm and dry. No rash noted. Pt is not diaphoretic.    Psychiatric: Normal mood and affect.  Nursing note and vitals reviewed.    ED Treatments / Results  Labs (all labs ordered are listed, but only abnormal results are displayed) Labs Reviewed  BASIC METABOLIC PANEL - Abnormal; Notable for the following components:      Result Value   Sodium 134 (*)    CO2 21 (*)    Glucose, Bld 478 (*)    All other components within normal limits  CBC - Abnormal; Notable for the following components:   Hemoglobin 11.6 (*)    All other components within normal limits  URINALYSIS, ROUTINE W REFLEX MICROSCOPIC - Abnormal; Notable for the following components:   Color, Urine STRAW (*)    Glucose, UA >=500 (*)    Leukocytes, UA SMALL (*)    Squamous Epithelial / LPF 0-5 (*)  All other components within normal limits  CBG MONITORING, ED - Abnormal; Notable for the following components:   Glucose-Capillary 431 (*)    All other components within normal limits  CBG MONITORING, ED - Abnormal; Notable for the following components:   Glucose-Capillary 380 (*)    All other components within normal limits  I-STAT BETA HCG BLOOD, ED ([facility], [facility], [facility] ONLY)    EKG  EKG Interpretation None       Radiology Dg Chest 2 View  Result Date: 03/25/2017 CLINICAL DATA:  Cough EXAM: CHEST  2 VIEW COMPARISON:  01/18/2015 FINDINGS: The heart size and mediastinal contours are within normal limits. Both lungs are clear. The visualized skeletal structures are unremarkable. IMPRESSION: No active cardiopulmonary disease. Electronically Signed   By: Jasmine PangKim  Fujinaga M.D.   On: 03/25/2017 23:50    Procedures Procedures (including critical care time)  Medications Ordered in ED Medications - No data to display   Initial Impression / Assessment and Plan / ED Course  I have reviewed the triage vital signs and the nursing notes.  Pertinent labs & imaging results that were available during my care of the patient were reviewed by me and considered in my medical decision making  (see chart for details).    Patients symptoms are consistent with URI, likely viral etiology. Discussed that antibiotics are not indicated for viral infections. Pt will be discharged with symptomatic treatment.  Patient is noted to be hyperglycemic, however her blood sugar is downtrending.  She has increased her insulin dose through her pump.  She understands that she will need to monitor this closely.  She does not have an elevated anion gap, and has no evidence of DKA.  Verbalizes understanding and is agreeable with plan. Pt is hemodynamically stable & in NAD prior to dc.   Final Clinical Impressions(s) / ED Diagnoses   Final diagnoses:  Upper respiratory tract infection, unspecified type    ED Discharge Orders        Ordered    benzonatate (TESSALON) 100 MG capsule  2 times daily PRN     03/26/17 0308       Roxy HorsemanBrowning, Marcheta Horsey, PA-C 03/26/17 0308    Ward, Layla MawKristen N, DO 03/26/17 [id number]

## 2017-03-28 ENCOUNTER — Emergency Department (HOSPITAL_COMMUNITY)
Admission: EM | Admit: 2017-03-28 | Discharge: 2017-03-29 | Discharge status: Against Medical Advice | Payer: Medicaid Other | Attending: Emergency Medicine | Admitting: Emergency Medicine

## 2017-03-28 ENCOUNTER — Encounter (HOSPITAL_COMMUNITY): Payer: Self-pay | Admitting: Emergency Medicine

## 2017-03-28 DIAGNOSIS — E1110 Type 2 diabetes mellitus with ketoacidosis without coma: Secondary | ICD-10-CM | POA: Diagnosis present

## 2017-03-28 DIAGNOSIS — J45909 Unspecified asthma, uncomplicated: Secondary | ICD-10-CM | POA: Diagnosis not present

## 2017-03-28 DIAGNOSIS — E1065 Type 1 diabetes mellitus with hyperglycemia: Secondary | ICD-10-CM | POA: Diagnosis not present

## 2017-03-28 DIAGNOSIS — R6883 Chills (without fever): Secondary | ICD-10-CM | POA: Diagnosis present

## 2017-03-28 DIAGNOSIS — E878 Other disorders of electrolyte and fluid balance, not elsewhere classified: Secondary | ICD-10-CM | POA: Insufficient documentation

## 2017-03-28 DIAGNOSIS — E1010 Type 1 diabetes mellitus with ketoacidosis without coma: Secondary | ICD-10-CM | POA: Diagnosis not present

## 2017-03-28 DIAGNOSIS — F17210 Nicotine dependence, cigarettes, uncomplicated: Secondary | ICD-10-CM | POA: Insufficient documentation

## 2017-03-28 LAB — URINALYSIS, ROUTINE W REFLEX MICROSCOPIC
BACTERIA UA: NONE SEEN
Bilirubin Urine: NEGATIVE
Glucose, UA: >=500 mg/dL — AB
Hgb urine dipstick: NEGATIVE
KETONES UR: 20 mg/dL — AB
LEUKOCYTES UA: NEGATIVE
Nitrite: NEGATIVE
PROTEIN: NEGATIVE mg/dL
Specific Gravity, Urine: 1.032 — ABNORMAL HIGH (ref 1.005–1.030)
pH: 6 (ref 5.0–8.0)

## 2017-03-28 LAB — CBC
HEMATOCRIT: 38.1 % (ref 36.0–46.0)
Hemoglobin: 12.5 g/dL (ref 12.0–15.0)
MCH: 25.8 pg — ABNORMAL LOW (ref 26.0–34.0)
MCHC: 32.8 g/dL (ref 30.0–36.0)
MCV: 78.6 fL (ref 78.0–100.0)
Platelets: 332 10*3/uL (ref 150–400)
RBC: 4.85 MIL/uL (ref 3.87–5.11)
RDW: 12.7 % (ref 11.5–15.5)
WBC: 6.8 10*3/uL (ref 4.0–10.5)

## 2017-03-28 LAB — BASIC METABOLIC PANEL
Anion gap: 15 (ref 5–15)
BUN: 9 mg/dL (ref 6–20)
CHLORIDE: 97 mmol/L — AB (ref 101–111)
CO2: 18 mmol/L — ABNORMAL LOW (ref 22–32)
Calcium: 9.4 mg/dL (ref 8.9–10.3)
Creatinine, Ser: 0.81 mg/dL (ref 0.44–1.00)
GFR calc non Af Amer: >60 mL/min (ref >60)
Glucose, Bld: 470 mg/dL — ABNORMAL HIGH (ref 65–99)
Potassium: 4 mmol/L (ref 3.5–5.1)
SODIUM: 130 mmol/L — AB (ref 135–145)

## 2017-03-28 LAB — I-STAT VENOUS BLOOD GAS, ED
Acid-base deficit: 5 mmol/L — ABNORMAL HIGH (ref 0.0–2.0)
Bicarbonate: 19.9 mmol/L — ABNORMAL LOW (ref 20.0–28.0)
O2 Saturation: 53 %
PCO2 VEN: 37.7 mmHg — AB (ref 44.0–60.0)
TCO2: 21 mmol/L — ABNORMAL LOW (ref 22–32)
pH, Ven: 7.331 (ref 7.250–7.430)
pO2, Ven: 30 mmHg — CL (ref 32.0–45.0)

## 2017-03-28 LAB — HCG, QUANTITATIVE, PREGNANCY

## 2017-03-28 LAB — CBG MONITORING, ED
GLUCOSE-CAPILLARY: 392 mg/dL — AB (ref 65–99)
Glucose-Capillary: 448 mg/dL — ABNORMAL HIGH (ref 65–99)

## 2017-03-28 MED ORDER — SODIUM CHLORIDE 0.9 % IV BOLUS (SEPSIS)
2000.0000 mL | Freq: Once | INTRAVENOUS | Status: AC
  Administered 2017-03-28: 2000 mL via INTRAVENOUS

## 2017-03-28 NOTE — ED Triage Notes (Signed)
BIB EMS from home, pt called out for HA, gen abd pain, and hyperglycemia. Pt states her sugars have been running in the 500's. CBG 513 with EMS.

## 2017-03-28 NOTE — ED Provider Notes (Signed)
[facility] Provider [facility]   CSN: [id number] Arrival date & time: 03/28/17  1922     History   Chief Complaint Chief Complaint  Patient presents with  . Hyperglycemia    HPI [name] is a 22 y.o. female.  Patient is a T1 diabetic who presents with symptoms of generalized body aches, and hot/cold chills for 3 days. She has not taken her temperature. She denies congestion, cough, nausea, vomiting or diarrhea. No known sick contacts. She reports increased thirst and polyuria without pain. She has been taking her medications as usual and reports she is no longer on an insulin pump. Her insulin dosing is based on her carb count. She states she did take insulin today even though she has not had any PO intake. She has had DKA in the past, last admission was 2012 per patient. She reports she does not feel this is acidosis.    The history is provided by the patient. No language interpreter was used.  Hyperglycemia  Associated symptoms: increased thirst and polyuria   Associated symptoms: no abdominal pain, no dysuria, no nausea, no vomiting and no weakness     Past Medical History:  Diagnosis Date  . Allergy    dust   . Anxiety   . Asthma   . Diabetes mellitus   . Learning disability   . Lupus   . Urinary tract infection   . Vision abnormalities    wears glasses    Patient Active Problem List   Diagnosis Date Noted  . DKA (diabetic ketoacidoses) (HCC) 07/28/2013  . Acute respiratory failure with hypoxia (HCC) 08/13/2012  . Hypoglycemia associated with diabetes (HCC) 04/13/2012  . 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 Arrhythmia 04/13/2012  . Depression 04/13/2012  . Diabetic peripheral neuropathy (HCC) 04/13/2012  . Diabetic hyperosmolar non-ketotic state (HCC) 04/04/2012  . Hyponatremia 04/04/2012  . Trichomoniasis 03/23/2012  . Hydradenitis  03/23/2012  . Hyperglycemia without ketosis 03/21/2012  . Short-term memory loss 04/30/2011  . 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 Type 1 diabetes mellitus not at goal [facility](HCC) 03/24/2011  . Insulin resistance 03/24/2011  . Acanthosis 03/24/2011  . Goiter 03/24/2011  . Xerosis cutis 02/25/2011  . Hypoalbuminemia 02/24/2011  . Edema of both legs 02/24/2011  . Eczema 02/24/2011  . Learning disability   . Folliculitis 02/13/2011  . Herpes genitalis 02/13/2011  . Bladder spasm 02/07/2011  . Bipolar disorder (HCC) 02/07/2011    Class: Chronic  . Altered mental status 02/07/2011  . Asthma 02/07/2011    Class: Chronic    Past Surgical History:  Procedure Laterality Date  . NO PAST SURGERIES      OB History    No data available       Home Medications    Prior to Admission medications   Medication Sig Start Date End Date Taking? Authorizing Provider  benzonatate (TESSALON) 100 MG capsule Take 2 capsules (200 mg total) by mouth 2 (two) times daily as needed for cough. 03/26/17  Yes Roxy HorsemanBrowning, Robert, PA-C  Insulin Human (INSULIN PUMP) SOLN Inject into the skin continuous. Novolog   Yes [provider]  Blood Gluc Meter Disp-Strips (BLOOD GLUCOSE METER DISPOSABLE) DEVI Use as directed 07/30/13   Hollice EspyKrishnan, Sendil K, MD  insulin aspart (NOVOLOG) 100 UNIT/ML FlexPen Inject 10 Units into the skin 3 (three) times daily with meals. Patient not taking: Reported on 07/01/2015 07/30/13   Virginia RochesterKrishnan, Sendil  K, MD  Insulin Detemir (LEVEMIR FLEXPEN) 100 UNIT/ML Pen Inject 35 Units into the skin 2 (two) times daily. 10am and 10pm Patient not taking: Reported on 07/01/2015 07/30/13   Hollice Espy, MD  Insulin Pen Needle (PEN NEEDLES 3/16") 31G X 5 MM MISC Use as directed 07/30/13   Hollice Espy, MD  Insulin Pen Needle 31G X 5 MM MISC Use with insulin pen 08/17/12   David Stall, MD  loratadine (CLARITIN) 10 MG tablet Take 1 tablet (10 mg total) by mouth daily. Patient not taking:  Reported on 07/01/2015 07/30/13   Hollice Espy, MD    Family History Family History  Adopted: Yes  Problem Relation Age of Onset  . Early death Sister   . Asthma Maternal Aunt   . Depression Maternal Aunt   . Mental illness Maternal Aunt   . Asthma Maternal Grandmother   . Thyroid disease Maternal Grandmother   . Obesity Maternal Grandmother   . Heart disease Neg Hx   . Diabetes Neg Hx     Social History Social History   Tobacco Use  . Smoking status: Current Every Day Smoker    Packs/day: 0.25    Types: Cigarettes  . Smokeless tobacco: Never Used  Substance Use Topics  . Alcohol use: No  . Drug use: No     Allergies   Patient has no known allergies.   Review of Systems Review of Systems  Constitutional: Positive for appetite change and chills.  HENT: Negative.   Respiratory: Negative.  Negative for cough.   Cardiovascular: Negative.   Gastrointestinal: Negative.  Negative for abdominal pain, nausea and vomiting.  Endocrine: Positive for polydipsia and polyuria.  Genitourinary: Positive for frequency. Negative for dysuria.  Musculoskeletal: Positive for myalgias.  Neurological: Negative for weakness and light-headedness.     Physical Exam Updated Vital Signs BP 110/68 (BP Location: Right Arm)   Pulse 100   Temp 98.7 F (37.1 C) (Oral)   Resp 18   Ht 5\' 4"  (1.626 m)   Wt 72.6 kg (160 lb)   LMP 03/09/2017   SpO2 99%   BMI 27.46 kg/m   Physical Exam  Constitutional: She appears well-developed and well-nourished.  HENT:  Head: Normocephalic.  Mouth/Throat: Oropharynx is clear and moist.  Neck: Normal range of motion. Neck supple.  Cardiovascular: Regular rhythm. Tachycardia present.  Pulmonary/Chest: Effort normal and breath sounds normal.  Abdominal: Soft. Bowel sounds are normal. There is no tenderness. There is no rebound and no guarding.  Musculoskeletal: Normal range of motion.  Neurological: She is alert. No cranial nerve deficit.    Skin: Skin is warm and dry. No rash noted.  Psychiatric: She has a normal mood and affect.     ED Treatments / Results  Labs (all labs ordered are listed, but only abnormal results are displayed) Labs Reviewed  BASIC METABOLIC PANEL - Abnormal; Notable for the following components:      Result Value   Sodium 130 (*)    Chloride 97 (*)    CO2 18 (*)    Glucose, Bld 470 (*)    All other components within normal limits  CBC - Abnormal; Notable for the following components:   MCH 25.8 (*)    All other components within normal limits  URINALYSIS, ROUTINE W REFLEX MICROSCOPIC - Abnormal; Notable for the following components:   Color, Urine STRAW (*)    Specific Gravity, Urine 1.032 (*)    Glucose, UA >=500 (*)  Ketones, ur 20 (*)    Squamous Epithelial / LPF 0-5 (*)    All other components within normal limits  CBG MONITORING, ED - Abnormal; Notable for the following components:   Glucose-Capillary 448 (*)    All other components within normal limits  HCG, QUANTITATIVE, PREGNANCY  BLOOD GAS, VENOUS    EKG  EKG Interpretation None       Radiology No results found.  Procedures Procedures (including critical care time)  Medications Ordered in ED Medications  sodium chloride 0.9 % bolus 2,000 mL (2,000 mLs Intravenous New Bag/Given 03/28/17 2303)     Initial Impression / Assessment and Plan / ED Course  I have reviewed the triage vital signs and the nursing notes.  Pertinent labs & imaging results that were available during my care of the patient were reviewed by me and considered in my medical decision making (see chart for details).     Patient is a type 1 diabetic who has symptoms of chills and body aches x 2-3 days. No congestion, cough, nausea, diarrhea. She found her blood sugar to be elevated and came to the emergency department for further evaluation.   She is very well appearing. VSS. No fever, tachycardia, hypoxia. Normal blood pressures. She eats and  drinks in the ED. Doubt influenza - suspect mild viral etiology.   She is hyperglycemic to 470 with a bicarb of 18. No anion gap. PH is neutral and does not show acidosis. IVF's started with frequent CBG's.   After 2 liters her blood sugar has decreased to 377. Discussed with Dr. Wilkie Aye. Will give insulin 10 mg IV and another liter of fluids and will recheck her chemistries. Plan to admit if bicarb does not improve.   On recheck of chemistries, her bicarb has decreased to 14. Discussed with [facility], Dr. Toniann Fail who advises to give further treatment with 1 liter LR and recheck Bmet at that time as the NS bolus quantity could be the cause of worsening bicarb. Will call [facility] back after repeat studies if admission is warranted.  Patient care is signed out at end of shift to [facility], PA-C, pending recheck of chemistries and re-evaluation of the patient.   Final Clinical Impressions(s) / ED Diagnoses   Final diagnoses:  None   1. Hyperglycemia 2. Low bicarb level without acidosis  ED Discharge Orders    None       Elpidio Anis, PA-C 03/29/17 [id number]    Margarita Grizzle, MD 03/29/17 [phone]

## 2017-03-28 NOTE — ED Notes (Signed)
ED Provider at bedside. 

## 2017-03-28 NOTE — ED Notes (Signed)
Pt ambulated to room without difficulty

## 2017-03-29 ENCOUNTER — Encounter (HOSPITAL_COMMUNITY): Payer: Self-pay | Admitting: Internal Medicine

## 2017-03-29 DIAGNOSIS — E1010 Type 1 diabetes mellitus with ketoacidosis without coma: Secondary | ICD-10-CM

## 2017-03-29 DIAGNOSIS — E878 Other disorders of electrolyte and fluid balance, not elsewhere classified: Secondary | ICD-10-CM

## 2017-03-29 DIAGNOSIS — E1065 Type 1 diabetes mellitus with hyperglycemia: Secondary | ICD-10-CM | POA: Diagnosis not present

## 2017-03-29 LAB — BASIC METABOLIC PANEL
Anion gap: 13 (ref 5–15)
Anion gap: 15 (ref 5–15)
BUN: 8 mg/dL (ref 6–20)
BUN: 7 mg/dL (ref 6–20)
CHLORIDE: 109 mmol/L (ref 101–111)
CHLORIDE: 101 mmol/L (ref 101–111)
CO2: 14 mmol/L — AB (ref 22–32)
CO2: 15 mmol/L — ABNORMAL LOW (ref 22–32)
Calcium: 7.9 mg/dL — ABNORMAL LOW (ref 8.9–10.3)
Calcium: 8.2 mg/dL — ABNORMAL LOW (ref 8.9–10.3)
Creatinine, Ser: 0.72 mg/dL (ref 0.44–1.00)
Creatinine, Ser: 0.76 mg/dL (ref 0.44–1.00)
GFR calc Af Amer: >60 mL/min (ref >60)
GFR calc Af Amer: >60 mL/min (ref >60)
GFR calc non Af Amer: >60 mL/min (ref >60)
GFR calc non Af Amer: >60 mL/min (ref >60)
GLUCOSE: 224 mg/dL — AB (ref 65–99)
GLUCOSE: 400 mg/dL — AB (ref 65–99)
Potassium: 3.6 mmol/L (ref 3.5–5.1)
Potassium: 4.3 mmol/L (ref 3.5–5.1)
Sodium: 136 mmol/L (ref 135–145)
Sodium: 131 mmol/L — ABNORMAL LOW (ref 135–145)

## 2017-03-29 LAB — CBG MONITORING, ED
Glucose-Capillary: 310 mg/dL — ABNORMAL HIGH (ref 65–99)
Glucose-Capillary: 377 mg/dL — ABNORMAL HIGH (ref 65–99)
Glucose-Capillary: 454 mg/dL — ABNORMAL HIGH (ref 65–99)

## 2017-03-29 MED ORDER — INSULIN ASPART 100 UNIT/ML ~~LOC~~ SOLN
10.0000 [IU] | Freq: Once | SUBCUTANEOUS | Status: AC
  Administered 2017-03-29: 10 [IU] via INTRAVENOUS
  Filled 2017-03-29: qty 1

## 2017-03-29 MED ORDER — SODIUM CHLORIDE 0.9 % IV SOLN
INTRAVENOUS | Status: DC
  Filled 2017-03-29: qty 1

## 2017-03-29 MED ORDER — INSULIN ASPART 100 UNIT/ML ~~LOC~~ SOLN
8.0000 [IU] | Freq: Once | SUBCUTANEOUS | Status: AC
  Administered 2017-03-29: 8 [IU] via INTRAVENOUS
  Filled 2017-03-29: qty 1

## 2017-03-29 MED ORDER — SODIUM CHLORIDE 0.9 % IV BOLUS (SEPSIS)
1000.0000 mL | Freq: Once | INTRAVENOUS | Status: AC
  Administered 2017-03-29: 1000 mL via INTRAVENOUS

## 2017-03-29 MED ORDER — LACTATED RINGERS IV BOLUS (SEPSIS)
1000.0000 mL | Freq: Once | INTRAVENOUS | Status: AC
  Administered 2017-03-29: 1000 mL via INTRAVENOUS

## 2017-03-29 MED ORDER — DEXTROSE-NACL 5-0.45 % IV SOLN
INTRAVENOUS | Status: DC
Start: 2017-03-29 — End: 2017-03-29

## 2017-03-29 NOTE — ED Notes (Signed)
Pt asking if she can take her home insulin, expresses concerns about missing dose.  This RN had discussed need for insulin with ED PA Alyssa, no new orders received.  This RN made EDP aware.

## 2017-03-29 NOTE — ED Notes (Signed)
Nurse drawing labs. 

## 2017-03-29 NOTE — ED Notes (Signed)
ED Provider at bedside. 

## 2017-03-29 NOTE — H&P (Signed)
History and Physical    [name] WUJ:[id number] DOB: 01-10-1996 DOA: 03/28/2017  [facility]:  Shawnee KnappLevy - endocrinology, appt 2/14 Patient coming from:  Home - lives with fiance; NOK: [employer], [phone]   Chief Complaint: Hyperglycemia  HPI: [name] is a 22 y.o. female with medical history significant of SLE, learning disability, and DM presenting with hyperglycemia.  She came in on 2/8 with temp 100.4; she came in to find out why and she was diagnosed with a URI and sent home with cough medication (not coughing).  When she came back today, she was told that they don't see any type of infection.  She came back because "my temperature was going up a little bit and my sugar was up and down."  She normally manages glucose with snacks, exercise, Novolog SSI and counting cards.  She has been on Levemir and Lantus in the past.  Last use of basal insulin was when she lived in [place].  Lact A1c was 10.8 in 7/18.  Glucose is still 310 currently.   ED Course:   Type 1 DM with hyperglycemia to 400s.  Overnight, patient hydrated, given insulin, and labs followed.  Initial borderline high anion gap, no DKA at that time.  After 3L volume repletion, bicarb dropped and so given 1L LR.  Bicarb 15.  Will observe for DKA.  Anion gap 15.  Review of Systems: As per HPI; otherwise review of systems reviewed and negative.   Ambulatory Status:  Ambulates without assistance  Past Medical History:  Diagnosis Date  . Allergy    dust   . Anxiety   . Asthma   . Diabetes mellitus   . Learning disability   . Lupus   . Urinary tract infection   . Vision abnormalities    wears glasses    Past Surgical History:  Procedure Laterality Date  . NO PAST SURGERIES      Social History   Socioeconomic History  . Marital status: Single    Spouse name: Not on file  . Number of children: Not on file  . Years of education: Not on file  . Highest education level: Not on file    Social Needs  . Financial resource strain: Not on file  . Food insecurity - worry: Not on file  . Food insecurity - inability: Not on file  . Transportation needs - medical: Not on file  . Transportation needs - non-medical: Not on file  Occupational History  . Occupation: Consulting civil engineertudent  Tobacco Use  . Smoking status: Current Every Day Smoker    Packs/day: 0.50    Types: Cigarettes    Start date: 2015  . Smokeless tobacco: Never Used  Substance and Sexual Activity  . Alcohol use: No  . Drug use: No  . Sexual activity: Yes    Birth control/protection: Patch, Other-see comments    Comment: Mother states that patient no longer uses the patch because she is no longer sexually active   Other Topics Concern  . Not on file  Social History Narrative   Lives with a friend.   In 12th grade.    No Known Allergies  Family History  Adopted: Yes  Problem Relation Age of Onset  . Early death Sister   . Asthma Maternal Aunt   . Depression Maternal Aunt   . Mental illness Maternal Aunt   . Asthma Maternal Grandmother   . Thyroid disease Maternal Grandmother   . Obesity Maternal Grandmother   .  Heart disease Neg Hx   . Diabetes Neg Hx     Prior to Admission medications   Medication Sig Start Date End Date Taking? Authorizing Provider  benzonatate (TESSALON) 100 MG capsule Take 2 capsules (200 mg total) by mouth 2 (two) times daily as needed for cough. 03/26/17  Yes Roxy Horseman, PA-C  Insulin Human (INSULIN PUMP) SOLN Inject into the skin continuous. Novolog   Yes [provider]  Blood Gluc Meter Disp-Strips (BLOOD GLUCOSE METER DISPOSABLE) DEVI Use as directed 07/30/13   Hollice Espy, MD  insulin aspart (NOVOLOG) 100 UNIT/ML FlexPen Inject 10 Units into the skin 3 (three) times daily with meals. Patient not taking: Reported on 07/01/2015 07/30/13   Hollice Espy, MD  Insulin Detemir (LEVEMIR FLEXPEN) 100 UNIT/ML Pen Inject 35 Units into the skin 2 (two) times daily.  10am and 10pm Patient not taking: Reported on 07/01/2015 07/30/13   Hollice Espy, MD  Insulin Pen Needle (PEN NEEDLES 3/16") 31G X 5 MM MISC Use as directed 07/30/13   Hollice Espy, MD  Insulin Pen Needle 31G X 5 MM MISC Use with insulin pen 08/17/12   David Stall, MD  loratadine (CLARITIN) 10 MG tablet Take 1 tablet (10 mg total) by mouth daily. Patient not taking: Reported on 07/01/2015 07/30/13   Hollice Espy, MD    Physical Exam: Vitals:   03/29/17 0830 03/29/17 0900 03/29/17 0930 03/29/17 0931  BP: 109/66 104/66 116/61   Pulse: 83 94 100 93  Resp:      Temp:      TempSrc:      SpO2: 96% 93% 96% 96%  Weight:      Height:         General:  Appears calm and comfortable and is NAD Eyes:  PERRL, EOMI, normal lids, iris ENT:  grossly normal hearing, lips & tongue, dry mm Neck:  no LAD, masses or thyromegaly Cardiovascular:  RRR, no m/r/g. No LE edema.  Respiratory:   CTA bilaterally with no wheezes/rales/rhonchi.  Normal respiratory effort. Abdomen:  soft, NT, ND, NABS Skin:  no rash or induration seen on limited exam Musculoskeletal:  grossly normal tone BUE/BLE, good ROM, no bony abnormality Psychiatric:  grossly normal mood and affect, speech fluent and appropriate, AOx3 Neurologic:  CN 2-12 grossly intact, moves all extremities in coordinated fashion, sensation intact    Radiological Exams on Admission: No results found.  EKG: not done   Labs on Admission: I have personally reviewed the available labs and imaging studies at the time of the admission.  Pertinent labs:   Glucose 470, 392, 377, 224, 400, 454 Na++ 130, 136, 131 K+ 4.3 CO2 18, 14, 15 VBG at 2324: 7.331/37.7/19.9 Essentially normal CBC  Assessment/Plan Principal Problem:   DKA (diabetic ketoacidosis) (HCC)   -Patient presented last night with DKA -It appears that she was managed unsuccessfully overnight primarily with IVF with 2 separate doses of IV insulin -Her anion gap has  remained in the high-normal range but she has had progressive acidosis -My best recommendation would be for at least short-term observation with the glucose stabilizer protocol -Unfortunately, however, the patient has transportation issues and prefers to sign out AMA -Given her ongoing mild DKA, would suggest accuchecks q4h with coverage with SSI -Aggressive PO hydration encouraged -Patient has f/u with endocrinologist this Thursday and has transportation already arranged. -She is out of insulin needles - likely at least a contributor if not the cause her current DKA -  and so the ER is able to provide some to her to help her get safely to endocrine on Thursday.   Jonah Blue MD [facility]  If note is complete, please contact covering daytime or nighttime physician. [email or web address] Password [facility]  03/29/2017, 11:16 AM

## 2017-03-29 NOTE — ED Provider Notes (Signed)
Physical Exam  BP 101/60   Pulse 80   Temp 98.7 F (37.1 C) (Oral)   Resp 19   Ht 5\' 4"  (1.626 m)   Wt 72.6 kg (160 lb)   LMP 03/09/2017   SpO2 98%   BMI 27.46 kg/m   Assumed care from Valeda MalmShair Upstill, PA-C at 7:37 AM. Briefly, the patient is a 22 y.o. female  who  has a past medical history of Allergy, Anxiety, Asthma, Diabetes mellitus, Learning disability, Lupus, Urinary tract infection, and Vision abnormalities. here with chills and myalgias and noting that her glucose was elevated at home.  Patient had no other complaints and felt well throughout her ED course.  Labs Reviewed  BASIC METABOLIC PANEL - Abnormal; Notable for the following components:      Result Value   Sodium 130 (*)    Chloride 97 (*)    CO2 18 (*)    Glucose, Bld 470 (*)    All other components within normal limits  CBC - Abnormal; Notable for the following components:   MCH 25.8 (*)    All other components within normal limits  URINALYSIS, ROUTINE W REFLEX MICROSCOPIC - Abnormal; Notable for the following components:   Color, Urine STRAW (*)    Specific Gravity, Urine 1.032 (*)    Glucose, UA >=500 (*)    Ketones, ur 20 (*)    Squamous Epithelial / LPF 0-5 (*)    All other components within normal limits  BASIC METABOLIC PANEL - Abnormal; Notable for the following components:   CO2 14 (*)    Glucose, Bld 224 (*)    Calcium 7.9 (*)    All other components within normal limits  CBG MONITORING, ED - Abnormal; Notable for the following components:   Glucose-Capillary 448 (*)    All other components within normal limits  I-STAT VENOUS BLOOD GAS, ED - Abnormal; Notable for the following components:   pCO2, Ven 37.7 (*)    pO2, Ven 30.0 (*)    Bicarbonate 19.9 (*)    TCO2 21 (*)    Acid-base deficit 5.0 (*)    All other components within normal limits  CBG MONITORING, ED - Abnormal; Notable for the following components:   Glucose-Capillary 392 (*)    All other components within normal limits  CBG  MONITORING, ED - Abnormal; Notable for the following components:   Glucose-Capillary 377 (*)    All other components within normal limits  HCG, QUANTITATIVE, PREGNANCY  BASIC METABOLIC PANEL    Course of Care: Over ED course, patient demonstrated an anion gap of 13, but normal pH.  Glucose was 448 on presentation.  Briefly, the plan per PA up still is to lower glucose, and raise bicarb, which dropped from 17-14 after administration of 3 L of normal saline.  The case was discussed with Dr. Toniann FailKakrakandy of [facility], who recommended 1 L of lactated Ringer's and repeat of the basic metabolic panel.  Bicarbonate is unchanged after 1 L of lactated Ringer's.  CBG currently 400.  This is after patient ate.  Patient feels well.  Will proceed to consult hospital medicine to see if patient requires further management for observation.  Physical Exam  Constitutional: She appears well-developed and well-nourished. No distress.  Sitting comfortably in bed.  HENT:  Head: Normocephalic and atraumatic.  Eyes: Conjunctivae are normal. Right eye exhibits no discharge. Left eye exhibits no discharge.  EOMs normal to gross examination.  Neck: Normal range of motion.  Cardiovascular: Normal  rate and regular rhythm.  Intact, 2+ radial pulse.  Pulmonary/Chest:  Normal respiratory effort. Patient converses comfortably. No audible wheeze or stridor.  Abdominal: She exhibits no distension.  Musculoskeletal: Normal range of motion.  Neurological: She is alert.  Cranial nerves intact to gross observation. Patient moves extremities without difficulty.  Skin: Skin is warm and dry. She is not diaphoretic.  Psychiatric: She has a normal mood and affect. Her behavior is normal. Judgment and thought content normal.  Nursing note and vitals reviewed.   ED Course/Procedures     Procedures  MDM   Due to low bicarbonate with attempted osmotic diuresis, will consult hospital medicine.  Per discussion with  Dr. Ophelia Charter, Dr. Ophelia Charter feels that patient is in DKA clinically at this time, and patient will be admitted for DKA.  Patient and her fianc are in understanding and agree with the plan of care.      Delia Chimes 03/29/17 1640    Benjiman Core, MD 04/01/17 2050

## 2017-05-01 ENCOUNTER — Other Ambulatory Visit: Payer: Self-pay

## 2017-05-01 ENCOUNTER — Encounter (HOSPITAL_COMMUNITY): Payer: Self-pay | Admitting: *Deleted

## 2017-05-01 ENCOUNTER — Inpatient Hospital Stay (HOSPITAL_COMMUNITY)
Admission: EM | Admit: 2017-05-01 | Discharge: 2017-05-03 | DRG: 638 | Discharge status: Against Medical Advice | Payer: Medicaid Other | Attending: Internal Medicine | Admitting: Internal Medicine

## 2017-05-01 DIAGNOSIS — E861 Hypovolemia: Secondary | ICD-10-CM | POA: Diagnosis present

## 2017-05-01 DIAGNOSIS — Z720 Tobacco use: Secondary | ICD-10-CM | POA: Diagnosis not present

## 2017-05-01 DIAGNOSIS — E1010 Type 1 diabetes mellitus with ketoacidosis without coma: Secondary | ICD-10-CM | POA: Diagnosis present

## 2017-05-01 DIAGNOSIS — E871 Hypo-osmolality and hyponatremia: Secondary | ICD-10-CM

## 2017-05-01 DIAGNOSIS — E1110 Type 2 diabetes mellitus with ketoacidosis without coma: Secondary | ICD-10-CM | POA: Diagnosis present

## 2017-05-01 DIAGNOSIS — E109 Type 1 diabetes mellitus without complications: Secondary | ICD-10-CM

## 2017-05-01 DIAGNOSIS — Z794 Long term (current) use of insulin: Secondary | ICD-10-CM

## 2017-05-01 DIAGNOSIS — F17210 Nicotine dependence, cigarettes, uncomplicated: Secondary | ICD-10-CM | POA: Diagnosis present

## 2017-05-01 LAB — CBC
HEMATOCRIT: 42.2 % (ref 36.0–46.0)
HEMOGLOBIN: 13 g/dL (ref 12.0–15.0)
MCH: 25 pg — ABNORMAL LOW (ref 26.0–34.0)
MCHC: 30.8 g/dL (ref 30.0–36.0)
MCV: 81 fL (ref 78.0–100.0)
Platelets: 337 10*3/uL (ref 150–400)
RBC: 5.21 MIL/uL — AB (ref 3.87–5.11)
RDW: 13.6 % (ref 11.5–15.5)
WBC: 6.7 10*3/uL (ref 4.0–10.5)

## 2017-05-01 LAB — COMPREHENSIVE METABOLIC PANEL
ALK PHOS: 62 U/L (ref 38–126)
ALT: 11 U/L — ABNORMAL LOW (ref 14–54)
ANION GAP: 17 — AB (ref 5–15)
AST: 20 U/L (ref 15–41)
Albumin: 4.7 g/dL (ref 3.5–5.0)
BILIRUBIN TOTAL: 1.1 mg/dL (ref 0.3–1.2)
BUN: 12 mg/dL (ref 6–20)
CALCIUM: 9.9 mg/dL (ref 8.9–10.3)
CO2: 17 mmol/L — ABNORMAL LOW (ref 22–32)
Chloride: 95 mmol/L — ABNORMAL LOW (ref 101–111)
Creatinine, Ser: 0.88 mg/dL (ref 0.44–1.00)
GFR calc Af Amer: >60 mL/min (ref >60)
GLUCOSE: 480 mg/dL — AB (ref 65–99)
POTASSIUM: 4.8 mmol/L (ref 3.5–5.1)
Sodium: 129 mmol/L — ABNORMAL LOW (ref 135–145)
TOTAL PROTEIN: 8.8 g/dL — AB (ref 6.5–8.1)

## 2017-05-01 LAB — URINALYSIS, ROUTINE W REFLEX MICROSCOPIC
BACTERIA UA: NONE SEEN
BILIRUBIN URINE: NEGATIVE
Glucose, UA: >=500 mg/dL — AB
Hgb urine dipstick: NEGATIVE
KETONES UR: 80 mg/dL — AB
Nitrite: NEGATIVE
PROTEIN: NEGATIVE mg/dL
Specific Gravity, Urine: 1.027 (ref 1.005–1.030)
pH: 5 (ref 5.0–8.0)

## 2017-05-01 LAB — CBG MONITORING, ED
GLUCOSE-CAPILLARY: 448 mg/dL — AB (ref 65–99)
GLUCOSE-CAPILLARY: 342 mg/dL — AB (ref 65–99)
GLUCOSE-CAPILLARY: 322 mg/dL — AB (ref 65–99)
Glucose-Capillary: 215 mg/dL — ABNORMAL HIGH (ref 65–99)
Glucose-Capillary: 262 mg/dL — ABNORMAL HIGH (ref 65–99)

## 2017-05-01 LAB — I-STAT BETA HCG BLOOD, ED (MC, WL, AP ONLY): I-stat hCG, quantitative: <5 m[IU]/mL (ref <5)

## 2017-05-01 MED ORDER — SODIUM CHLORIDE 0.9 % IV SOLN
INTRAVENOUS | Status: DC
  Administered 2017-05-01: 2.8 [IU]/h via INTRAVENOUS
  Filled 2017-05-01: qty 1

## 2017-05-01 MED ORDER — BENZONATATE 100 MG PO CAPS: 200.0000 mg | ORAL_CAPSULE | Freq: Two times a day (BID) | ORAL | Status: DC | PRN

## 2017-05-01 MED ORDER — POTASSIUM CHLORIDE 10 MEQ/100ML IV SOLN: 10.0000 meq | INTRAVENOUS | Status: DC

## 2017-05-01 MED ORDER — DEXTROSE-NACL 5-0.45 % IV SOLN: INTRAVENOUS | Status: DC

## 2017-05-01 MED ORDER — LACTATED RINGERS IV SOLN: INTRAVENOUS | Status: DC

## 2017-05-01 MED ORDER — ENOXAPARIN SODIUM 40 MG/0.4ML ~~LOC~~ SOLN
40.0000 mg | Freq: Every day | SUBCUTANEOUS | Status: DC
  Administered 2017-05-03: 40 mg via SUBCUTANEOUS
  Filled 2017-05-01 (×2): qty 0.4

## 2017-05-01 MED ORDER — DEXTROSE-NACL 5-0.45 % IV SOLN
INTRAVENOUS | Status: DC
  Administered 2017-05-01: via INTRAVENOUS

## 2017-05-01 MED ORDER — LORATADINE 10 MG PO TABS
10.0000 mg | ORAL_TABLET | Freq: Every day | ORAL | Status: DC
Start: 2017-05-02 — End: 2017-05-03
  Filled 2017-05-01 (×2): qty 1

## 2017-05-01 MED ORDER — SODIUM CHLORIDE 0.9 % IV BOLUS (SEPSIS)
1000.0000 mL | Freq: Once | INTRAVENOUS | Status: AC
  Administered 2017-05-01: 1000 mL via INTRAVENOUS

## 2017-05-01 MED ORDER — SODIUM CHLORIDE 0.9 % IV SOLN
INTRAVENOUS | Status: DC
  Administered 2017-05-01 – 2017-05-02 (×2): via INTRAVENOUS

## 2017-05-01 MED ORDER — ONDANSETRON HCL 4 MG/2ML IJ SOLN: 4.0000 mg | Freq: Once | INTRAMUSCULAR | Status: DC

## 2017-05-01 MED ORDER — SODIUM CHLORIDE 0.9 % IV SOLN
INTRAVENOUS | Status: DC
  Filled 2017-05-01: qty 1

## 2017-05-01 MED ORDER — POTASSIUM CHLORIDE 10 MEQ/100ML IV SOLN
10.0000 meq | INTRAVENOUS | Status: AC
  Administered 2017-05-01 (×2): 10 meq via INTRAVENOUS
  Filled 2017-05-01 (×2): qty 100

## 2017-05-01 NOTE — ED Provider Notes (Signed)
[facility] EMERGENCY DEPARTMENT Provider Note   CSN: [id number] Arrival date & time: 05/01/17  1603     History   Chief Complaint No chief complaint on file.   HPI [name] is a 22 y.o. female.  HPI  22 year old female with hyperglycemia.  Patient reports that she used to get her endocrine care through [facility] in [facility].  She was lost to follow-up because of transportation issues.  She subsequently ran out of her medications so she went to [facility] about a week and a half ago.  She reports that they prescribed her insulin, but not her typical regimen.  Since then her blood sugars have been elevated.  In the last couple days they have been in the 3-400 range.  He has had polyuria and polydipsia.  Very mild abdominal pain.  Mild headache.  No nausea or vomiting.   (Of note, Pt was seen in [facility] ED on 03/29/17 with similar clinical picture and left AMA because of transportation issues).   Past Medical History:  Diagnosis Date  . Allergy    dust   . Anxiety   . Asthma   . Diabetes mellitus    Insulin Dependant  . Learning disability   . Lupus   . Urinary tract infection   . Vision abnormalities    wears glasses    Patient Active Problem List   Diagnosis Date Noted  . DKA (diabetic ketoacidosis) (HCC) 07/28/2013  . Acute respiratory failure with hypoxia (HCC) 08/13/2012  . Hypoglycemia associated with diabetes (HCC) 04/13/2012  . 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 Arrhythmia 04/13/2012  . Depression 04/13/2012  . Diabetic peripheral neuropathy (HCC) 04/13/2012  . Diabetic hyperosmolar non-ketotic state (HCC) 04/04/2012  . Hyponatremia 04/04/2012  . Trichomoniasis 03/23/2012  . Hydradenitis 03/23/2012  . Hyperglycemia without ketosis 03/21/2012  . Short-term memory loss 04/30/2011  . 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 Type 1  diabetes mellitus not at goal [facility]) 03/24/2011  . Insulin resistance 03/24/2011  . Acanthosis 03/24/2011  . Goiter 03/24/2011  . Xerosis cutis 02/25/2011  . Hypoalbuminemia 02/24/2011  . Edema of both legs 02/24/2011  . Eczema 02/24/2011  . Learning disability   . Folliculitis 02/13/2011  . Herpes genitalis 02/13/2011  . Bladder spasm 02/07/2011  . Bipolar disorder (HCC) 02/07/2011    Class: Chronic  . Altered mental status 02/07/2011  . Asthma 02/07/2011    Class: Chronic    Past Surgical History:  Procedure Laterality Date  . NO PAST SURGERIES      OB History    No data available       Home Medications    Prior to Admission medications   Medication Sig Start Date End Date Taking? Authorizing Provider  benzonatate (TESSALON) 100 MG capsule Take 2 capsules (200 mg total) by mouth 2 (two) times daily as needed for cough. 03/26/17   Roxy Horseman, PA-C  Blood Gluc Meter Disp-Strips (BLOOD GLUCOSE METER DISPOSABLE) DEVI Use as directed 07/30/13   Hollice Espy, MD  insulin aspart (NOVOLOG) 100 UNIT/ML FlexPen Inject 10 Units into the skin 3 (three) times daily with meals. Patient not taking: Reported on 07/01/2015 07/30/13   Hollice Espy, MD  Insulin Detemir (LEVEMIR FLEXPEN) 100 UNIT/ML Pen Inject 35 Units into the skin 2 (two) times daily. 10am and 10pm Patient not taking: Reported on 07/01/2015 07/30/13   Hollice Espy, MD  Insulin Human (INSULIN PUMP) SOLN Inject into the skin continuous. Novolog    [provider]  Insulin Pen Needle (PEN NEEDLES 3/16") 31G X 5 MM MISC Use as directed 07/30/13   Hollice Espy, MD  Insulin Pen Needle 31G X 5 MM MISC Use with insulin pen 08/17/12   David Stall, MD  loratadine (CLARITIN) 10 MG tablet Take 1 tablet (10 mg total) by mouth daily. Patient not taking: Reported on 07/01/2015 07/30/13   Hollice Espy, MD    Family History Family History  Adopted: Yes  Problem Relation Age of Onset  . Early  death Sister   . Asthma Maternal Aunt   . Depression Maternal Aunt   . Mental illness Maternal Aunt   . Asthma Maternal Grandmother   . Thyroid disease Maternal Grandmother   . Obesity Maternal Grandmother   . Heart disease Neg Hx   . Diabetes Neg Hx     Social History Social History   Tobacco Use  . Smoking status: Current Every Day Smoker    Packs/day: 0.50    Types: Cigarettes    Start date: 2015  . Smokeless tobacco: Never Used  Substance Use Topics  . Alcohol use: No  . Drug use: No     Allergies   Patient has no known allergies.   Review of Systems Review of Systems  All systems reviewed and negative, other than as noted in HPI.  Physical Exam Updated Vital Signs BP 113/72 (BP Location: Right Arm)   Pulse 90   Temp 98.1 F (36.7 C) (Oral)   Resp 16   Ht 5\' 4"  (1.626 m)   Wt 72.6 kg (160 lb)   LMP 04/04/2017   SpO2 100%   BMI 27.46 kg/m   Physical Exam  Constitutional: She appears well-developed and well-nourished. No distress.  HENT:  Head: Normocephalic and atraumatic.  Eyes: Conjunctivae are normal. Right eye exhibits no discharge. Left eye exhibits no discharge.  Neck: Neck supple.  Cardiovascular: Normal rate, regular rhythm and normal heart sounds. Exam reveals no gallop and no friction rub.  No murmur heard. Pulmonary/Chest: Effort normal and breath sounds normal. No respiratory distress.  Abdominal: Soft. She exhibits no distension. There is no tenderness.  Musculoskeletal: She exhibits no edema or tenderness.  Neurological: She is alert.  Skin: Skin is warm and dry.  Psychiatric: She has a normal mood and affect. Her behavior is normal. Thought content normal.  Nursing note and vitals reviewed.    ED Treatments / Results  Labs (all labs ordered are listed, but only abnormal results are displayed) Labs Reviewed  CBC - Abnormal; Notable for the following components:      Result Value   RBC 5.21 (*)    MCH 25.0 (*)    All other  components within normal limits  URINALYSIS, ROUTINE W REFLEX MICROSCOPIC - Abnormal; Notable for the following components:   Color, Urine STRAW (*)    Glucose, UA >=500 (*)    Ketones, ur 80 (*)    Leukocytes, UA SMALL (*)    Squamous Epithelial / LPF 0-5 (*)    All other components within normal limits  COMPREHENSIVE METABOLIC PANEL - Abnormal; Notable for the following components:   Sodium 129 (*)    Chloride 95 (*)    CO2 17 (*)    Glucose, Bld 480 (*)    Total Protein 8.8 (*)    ALT 11 (*)    Anion gap 17 (*)  All other components within normal limits  CBG MONITORING, ED - Abnormal; Notable for the following components:   Glucose-Capillary 448 (*)    All other components within normal limits  I-STAT BETA HCG BLOOD, ED ([facility], [facility], [facility] ONLY)    EKG  EKG Interpretation None       Radiology No results found.  Procedures Procedures (including critical care time)  CRITICAL CARE Performed by: Raeford RazorStephen Cloyce Paterson Total critical care time: 35 minutes Critical care time was exclusive of separately billable procedures and treating other patients. Critical care was necessary to treat or prevent imminent or life-threatening deterioration. Critical care was time spent personally by me on the following activities: development of treatment plan with patient and/or surrogate as well as nursing, discussions with consultants, evaluation of patient's response to treatment, examination of patient, obtaining history from patient or surrogate, ordering and performing treatments and interventions, ordering and review of laboratory studies, ordering and review of radiographic studies, pulse oximetry and re-evaluation of patient's condition.   Medications Ordered in ED Medications  insulin regular (NOVOLIN R,HUMULIN R) 100 Units in sodium chloride 0.9 % 100 mL (1 Units/mL) infusion (not administered)  lactated ringers infusion (not administered)  dextrose 5 %-0.45 % sodium chloride infusion (not  administered)  potassium chloride 10 mEq in 100 mL IVPB (not administered)  ondansetron (ZOFRAN) injection 4 mg (not administered)  sodium chloride 0.9 % bolus 1,000 mL (not administered)     Initial Impression / Assessment and Plan / ED Course  I have reviewed the triage vital signs and the nursing notes.  Pertinent labs & imaging results that were available during my care of the patient were reviewed by me and considered in my medical decision making (see chart for details).     22 year old female with mild diabetic ketoacidosis.  She already had insulin, fluids and potassium ordered through provider in triage prior to my evaluation of her.  Needs admission for ongoing management.  Final Clinical Impressions(s) / ED Diagnoses   Final diagnoses:  Diabetic ketoacidosis without coma associated with type 1 diabetes mellitus [facility](HCC)    ED Discharge Orders    None       Raeford RazorKohut, James Lafalce, MD 05/01/17 2001

## 2017-05-01 NOTE — ED Triage Notes (Signed)
Pt feels she is in DKA.  Recently tx for same, but sent home with 70/30 when she should have been given short acting only. States frequent urination, dry mouth, etc.

## 2017-05-01 NOTE — H&P (Signed)
History and Physical    [name] ZOX:[id number] DOB: 09/23/1995 DOA: 05/01/2017  PCP: [facility]   Patient coming from: Home  Chief Complaint: Polydipsia, polyuria, abdominal discomfort   HPI: [name] is a 22 y.o. female with medical history significant for type 1 diabetes mellitus, now presenting to the emergency department for evaluation of polydipsia, polyuria, and abdominal discomfort.  Patient reports that she had been in her usual state of health until approximately 2 days ago when she noted polyuria and polydipsia.  Since that time, the symptoms have progressed and she has also developed some mild abdominal discomfort.  She denies fevers, chills, chest pain, cough, or shortness of breath.  Denies dysuria or flank pain.  Reports that she has run out of testing supplies, and reports that she had been well controlled while managed with insulin pump, but is no longer followed by endocrinology and seems unclear about her new insulin prescription.  ED Course: Upon arrival to the ED, patient is found to be afebrile, saturating well on room air, and with vitals otherwise normal.  Chemistry panel is notable for a sodium of 129, bicarbonate 17, anion gap 17, and serum glucose 480.  CBC is unremarkable.  Urinalysis is notable for glucosuria and ketonuria.  Patient was given a liter of normal saline in the emergency department and started on insulin infusion.  She remains hemodynamically stable, in no apparent respiratory distress, and will be admitted to the stepdown unit for ongoing evaluation and management of diabetic ketoacidosis.  Review of Systems:  All other systems reviewed and apart from HPI, are negative.  Past Medical History:  Diagnosis Date  . Allergy    dust   . Anxiety   . Asthma   . Diabetes mellitus    Insulin Dependant  . Learning disability   . Lupus   . Urinary tract infection   . Vision abnormalities    wears glasses    Past Surgical History:    Procedure Laterality Date  . NO PAST SURGERIES       reports that she has been smoking cigarettes.  She started smoking about 4 years ago. She has been smoking about 0.50 packs per day. she has never used smokeless tobacco. She reports that she does not drink alcohol or use drugs.  No Known Allergies  Family History  Adopted: Yes  Problem Relation Age of Onset  . Early death Sister   . Asthma Maternal Aunt   . Depression Maternal Aunt   . Mental illness Maternal Aunt   . Asthma Maternal Grandmother   . Thyroid disease Maternal Grandmother   . Obesity Maternal Grandmother   . Heart disease Neg Hx   . Diabetes Neg Hx      Prior to Admission medications   Medication Sig Start Date End Date Taking? Authorizing Provider  benzonatate (TESSALON) 100 MG capsule Take 2 capsules (200 mg total) by mouth 2 (two) times daily as needed for cough. 03/26/17   Roxy Horseman, PA-C  Blood Gluc Meter Disp-Strips (BLOOD GLUCOSE METER DISPOSABLE) DEVI Use as directed 07/30/13   Hollice Espy, MD  insulin aspart (NOVOLOG) 100 UNIT/ML FlexPen Inject 10 Units into the skin 3 (three) times daily with meals. Patient not taking: Reported on 07/01/2015 07/30/13   Hollice Espy, MD  Insulin Detemir (LEVEMIR FLEXPEN) 100 UNIT/ML Pen Inject 35 Units into the skin 2 (two) times daily. 10am and 10pm Patient not taking: Reported on 07/01/2015 07/30/13   Hollice Espy,  MD  Insulin Human (INSULIN PUMP) SOLN Inject into the skin continuous. Novolog    [provider]  Insulin Pen Needle (PEN NEEDLES 3/16") 31G X 5 MM MISC Use as directed 07/30/13   Hollice Espy, MD  Insulin Pen Needle 31G X 5 MM MISC Use with insulin pen 08/17/12   David Stall, MD  loratadine (CLARITIN) 10 MG tablet Take 1 tablet (10 mg total) by mouth daily. Patient not taking: Reported on 07/01/2015 07/30/13   Hollice Espy, MD    Physical Exam: Vitals:   05/01/17 1636 05/01/17 1637 05/01/17 1942  BP: 107/67   113/72  Pulse: 80  90  Resp: 16  16  Temp: 98.1 F (36.7 C)    TempSrc: Oral    SpO2: 94%  100%  Weight:  72.6 kg (160 lb)   Height:  5\' 4"  (1.626 m)       Constitutional: NAD, calm Eyes: PERTLA, lids and conjunctivae normal ENMT: Mucous membranes are dry. Posterior pharynx clear of any exudate or lesions.   Neck: normal, supple, no masses, no thyromegaly Respiratory: clear to auscultation bilaterally, no wheezing, no crackles. Normal respiratory effort.    Cardiovascular: S1 & S2 heard, regular rate and rhythm. No extremity edema. No significant JVD. Abdomen: No distension, no tenderness, soft, mild generalized tenderness, no rebound pain or guarding. Bowel sounds normal.  Musculoskeletal: no clubbing / cyanosis. No joint deformity upper and lower extremities.    Skin: no significant rashes, lesions, ulcers. Warm, dry, well-perfused. Neurologic: CN 2-12 grossly intact. Sensation intact. Strength 5/5 in all 4 limbs.  Psychiatric:  Alert and oriented x 3. Calm, cooperative.     Labs on Admission: I have personally reviewed following labs and imaging studies  CBC: Recent Labs  Lab 05/01/17 1705  WBC 6.7  HGB 13.0  HCT 42.2  MCV 81.0  PLT 337   Basic Metabolic Panel: Recent Labs  Lab 05/01/17 1705  NA 129*  K 4.8  CL 95*  CO2 17*  GLUCOSE 480*  BUN 12  CREATININE 0.88  CALCIUM 9.9   GFR: Estimated Creatinine Clearance: 97.2 mL/min (by C-G formula based on SCr of 0.88 mg/dL). Liver Function Tests: Recent Labs  Lab 05/01/17 1705  AST 20  ALT 11*  ALKPHOS 62  BILITOT 1.1  PROT 8.8*  ALBUMIN 4.7   No results for input(s): LIPASE, AMYLASE in the last 168 hours. No results for input(s): AMMONIA in the last 168 hours. Coagulation Profile: No results for input(s): INR, PROTIME in the last 168 hours. Cardiac Enzymes: No results for input(s): CKTOTAL, CKMB, CKMBINDEX, TROPONINI in the last 168 hours. BNP (last 3 results) No results for input(s): PROBNP in  the last 8760 hours. HbA1C: No results for input(s): HGBA1C in the last 72 hours. CBG: Recent Labs  Lab 05/01/17 1644 05/01/17 2017  GLUCAP 448* 342*   Lipid Profile: No results for input(s): CHOL, HDL, LDLCALC, TRIG, CHOLHDL, LDLDIRECT in the last 72 hours. Thyroid Function Tests: No results for input(s): TSH, T4TOTAL, FREET4, T3FREE, THYROIDAB in the last 72 hours. Anemia Panel: No results for input(s): VITAMINB12, FOLATE, FERRITIN, TIBC, IRON, RETICCTPCT in the last 72 hours. Urine analysis:    Component Value Date/Time   COLORURINE STRAW (A) 05/01/2017 1640   APPEARANCEUR CLEAR 05/01/2017 1640   LABSPEC 1.027 05/01/2017 1640   PHURINE 5.0 05/01/2017 1640   GLUCOSEU >=500 (A) 05/01/2017 1640   HGBUR NEGATIVE 05/01/2017 1640   BILIRUBINUR NEGATIVE 05/01/2017 1640   KETONESUR  80 (A) 05/01/2017 1640   PROTEINUR NEGATIVE 05/01/2017 1640   UROBILINOGEN 0.2 07/28/2013 1729   NITRITE NEGATIVE 05/01/2017 1640   LEUKOCYTESUR SMALL (A) 05/01/2017 1640   Sepsis Labs: @LABRCNTIP (procalcitonin:4,lacticidven:4) )No results found for this or any previous visit (from the past 240 hour(s)).   Radiological Exams on Admission: No results found.  EKG: Not performed.   Assessment/Plan   1. DKA; type I DM  - Patient is type I diabetic with A1c 10.8% in July 2018, previously >14%  - Presents with dry mouth, abdominal discomfort, polyuria, and polydipsia  - Found to have serum glucose 480 with bicarbonate of 17, AG 17, and urine ketones  - No apparent infectious process or other acute medical issue as precipitant; more likely secondary to running out of diabetic supplies and poor understanding of her insulin prescriptions   - Treated in ED with 1 liters NS and started on insulin infusion  - Continue IVF hydration, continue insulin infusion with frequent CBG's and serial chem panels, diabetic coordinator consultation    2. Hyponatremia  - Serum sodium is 129 on admission  - Secondary  to hyperglycemia and hypovolemia, anticipate resolution with fluid-resuscitation and IV insulin  - Following serial chem panels as above    DVT prophylaxis: Lovenox Code Status: Full  Family Communication: Discussed with patient Consults called: None Admission status: Inpatient    Briscoe Deutscherimothy S Minnah Llamas, MD [facility] Pager [phone]  If 7PM-7AM, please contact night-coverage [email or web address] Password [email or web address]  05/01/2017, 8:21 PM

## 2017-05-02 ENCOUNTER — Encounter (HOSPITAL_COMMUNITY): Payer: Self-pay

## 2017-05-02 ENCOUNTER — Other Ambulatory Visit: Payer: Self-pay

## 2017-05-02 LAB — BASIC METABOLIC PANEL
ANION GAP: 9 (ref 5–15)
ANION GAP: 12 (ref 5–15)
BUN: 9 mg/dL (ref 6–20)
BUN: 11 mg/dL (ref 6–20)
CHLORIDE: 106 mmol/L (ref 101–111)
CHLORIDE: 102 mmol/L (ref 101–111)
CO2: 19 mmol/L — ABNORMAL LOW (ref 22–32)
CO2: 18 mmol/L — ABNORMAL LOW (ref 22–32)
Calcium: 8.2 mg/dL — ABNORMAL LOW (ref 8.9–10.3)
Calcium: 8.8 mg/dL — ABNORMAL LOW (ref 8.9–10.3)
Creatinine, Ser: 0.57 mg/dL (ref 0.44–1.00)
Creatinine, Ser: 0.75 mg/dL (ref 0.44–1.00)
GFR calc Af Amer: >60 mL/min (ref >60)
GFR calc non Af Amer: >60 mL/min (ref >60)
Glucose, Bld: 229 mg/dL — ABNORMAL HIGH (ref 65–99)
Glucose, Bld: 470 mg/dL — ABNORMAL HIGH (ref 65–99)
POTASSIUM: 4.1 mmol/L (ref 3.5–5.1)
Potassium: 4.1 mmol/L (ref 3.5–5.1)
SODIUM: 134 mmol/L — AB (ref 135–145)
SODIUM: 132 mmol/L — AB (ref 135–145)

## 2017-05-02 LAB — GLUCOSE, CAPILLARY
GLUCOSE-CAPILLARY: 148 mg/dL — AB (ref 65–99)
GLUCOSE-CAPILLARY: 157 mg/dL — AB (ref 65–99)
GLUCOSE-CAPILLARY: 377 mg/dL — AB (ref 65–99)
GLUCOSE-CAPILLARY: 414 mg/dL — AB (ref 65–99)
GLUCOSE-CAPILLARY: 263 mg/dL — AB (ref 65–99)
Glucose-Capillary: 207 mg/dL — ABNORMAL HIGH (ref 65–99)
Glucose-Capillary: 207 mg/dL — ABNORMAL HIGH (ref 65–99)
Glucose-Capillary: 143 mg/dL — ABNORMAL HIGH (ref 65–99)
Glucose-Capillary: 104 mg/dL — ABNORMAL HIGH (ref 65–99)
Glucose-Capillary: 90 mg/dL (ref 65–99)
Glucose-Capillary: 325 mg/dL — ABNORMAL HIGH (ref 65–99)

## 2017-05-02 LAB — CBG MONITORING, ED: GLUCOSE-CAPILLARY: 195 mg/dL — AB (ref 65–99)

## 2017-05-02 LAB — MRSA PCR SCREENING: MRSA BY PCR: NEGATIVE

## 2017-05-02 MED ORDER — INSULIN STARTER KIT- PEN NEEDLES (ENGLISH)
1.0000 | Freq: Once | Status: DC
  Filled 2017-05-02: qty 1

## 2017-05-02 MED ORDER — INSULIN ASPART 100 UNIT/ML ~~LOC~~ SOLN
0.0000 [IU] | Freq: Every day | SUBCUTANEOUS | Status: DC
  Administered 2017-05-02: 3 [IU] via SUBCUTANEOUS

## 2017-05-02 MED ORDER — ASPIRIN-ACETAMINOPHEN-CAFFEINE 250-250-65 MG PO TABS
2.0000 | ORAL_TABLET | Freq: Four times a day (QID) | ORAL | Status: DC | PRN
  Administered 2017-05-02 – 2017-05-03 (×3): 2 via ORAL
  Filled 2017-05-02 (×4): qty 2

## 2017-05-02 MED ORDER — INSULIN ASPART 100 UNIT/ML ~~LOC~~ SOLN
3.0000 [IU] | Freq: Three times a day (TID) | SUBCUTANEOUS | Status: DC
  Administered 2017-05-02 – 2017-05-03 (×3): 3 [IU] via SUBCUTANEOUS

## 2017-05-02 MED ORDER — INSULIN ASPART 100 UNIT/ML ~~LOC~~ SOLN
0.0000 [IU] | Freq: Three times a day (TID) | SUBCUTANEOUS | Status: DC
  Administered 2017-05-02: 7 [IU] via SUBCUTANEOUS
  Administered 2017-05-03: 5 [IU] via SUBCUTANEOUS
  Administered 2017-05-03: 3 [IU] via SUBCUTANEOUS

## 2017-05-02 MED ORDER — INSULIN GLARGINE 100 UNIT/ML ~~LOC~~ SOLN
20.0000 [IU] | Freq: Two times a day (BID) | SUBCUTANEOUS | Status: DC
  Administered 2017-05-02 – 2017-05-03 (×2): 20 [IU] via SUBCUTANEOUS
  Filled 2017-05-02 (×2): qty 0.2

## 2017-05-02 MED ORDER — INSULIN ASPART 100 UNIT/ML ~~LOC~~ SOLN
0.0000 [IU] | Freq: Three times a day (TID) | SUBCUTANEOUS | Status: DC
Start: 2017-05-02 — End: 2017-05-02

## 2017-05-02 MED ORDER — SODIUM CHLORIDE 0.9 % IV SOLN
INTRAVENOUS | Status: DC
  Administered 2017-05-02: 1000 mL via INTRAVENOUS
  Administered 2017-05-02: 500 mL via INTRAVENOUS

## 2017-05-02 MED ORDER — INSULIN ASPART 100 UNIT/ML ~~LOC~~ SOLN: 3.0000 [IU] | Freq: Three times a day (TID) | SUBCUTANEOUS | Status: DC

## 2017-05-02 MED ORDER — INSULIN ASPART 100 UNIT/ML ~~LOC~~ SOLN: 0.0000 [IU] | Freq: Three times a day (TID) | SUBCUTANEOUS | Status: DC

## 2017-05-02 MED ORDER — INSULIN ASPART 100 UNIT/ML ~~LOC~~ SOLN
14.0000 [IU] | Freq: Once | SUBCUTANEOUS | Status: AC
  Administered 2017-05-02: 14 [IU] via SUBCUTANEOUS

## 2017-05-02 MED ORDER — INSULIN GLARGINE 100 UNIT/ML ~~LOC~~ SOLN
10.0000 [IU] | Freq: Every day | SUBCUTANEOUS | Status: DC
  Administered 2017-05-02: 10 [IU] via SUBCUTANEOUS
  Filled 2017-05-02: qty 0.1

## 2017-05-02 NOTE — Progress Notes (Addendum)
Inpatient Diabetes Program Recommendations  AACE/ADA: New Consensus Statement on Inpatient Glycemic Control (2015)  Target Ranges:  Prepandial:   less than 140 mg/dL      Peak postprandial:   less than 180 mg/dL (1-2 hours)      Critically ill patients:  140 - 180 mg/dL   Lab Results  Component Value Date   GLUCAP 157 (H) 05/02/2017   HGBA1C 14.0 (H) 07/28/2013    Review of Glycemic Control  Diabetes history: DM1 Outpatient Diabetes medications: patient has only been taking Novolog sliding scale tid meal coverage Current orders for Inpatient glycemic control: Lantus 10 units + Novolog 3 units tid + Novolog correction tid + hs  Inpatient Diabetes Program Recommendations:    Increase Lantus to 20 units bid  Spoke with patient @ bedside regarding diabetes management. Patient states she has not been able to get in to see a new endocrinologist in order to get supplies for omnipod. Patient last saw endocrinologist @ [facility] 08-26-17 and states she is waiting to get appointment for a new endocrinologist. Reviewed with patient that since she is type 1 that she needs basal and short acting insulin on a regular basis. Patient states understanding. Patient states she was on the omnipod but ran out of supplies due to needing to see an endocrinologist. Reminded patient to keep her PCP up to date on CBGs that is assigned to her @ discharge.  Will follow.  Thank you, Billy FischerJudy E. Clancey Welton, RN, MSN, CDE  Diabetes Coordinator Inpatient Glycemic Control Team Team Pager [phone] (8am-5pm) 05/02/2017 12:07 PM

## 2017-05-02 NOTE — Progress Notes (Signed)
Insulin drip was DC'd by night shift RN. No subq insulin in MAR. Contacted Dr Sharon SellerMcClung how to proceed he instructed to continue to monitor. Patient CBG at [phone] and at 1144 is 377. Text paged Dr of these results. Will continue to monitor patient.

## 2017-05-02 NOTE — Progress Notes (Signed)
Chaplain was called to Pt.'s room by way of Spiritual Care consult. The two times where visit was attempted were not good for the patient. Chaplain will follow up on Tues. 03.19.19

## 2017-05-02 NOTE — Progress Notes (Signed)
[facility] TEAM 1 - Stepdown/ICU TEAM  Hilbert Odor  ZOX:[id number] DOB: 10-31-1995 DOA: 05/01/2017 PCP: [facility]    Brief Narrative:  22yo female with a history of type 1 Dm who presented to the ED w/ polydipsia, polyuria, and abdominal discomfort for 2 days.  She reported she ran out of testing supplies, and further reports that she is no longer followed by Endocrinology and was unclear about her new insulin regimen.  Her labs in the ED were suggestive of an early DKA state.  Significant Events: 3/17 admit   Subjective: The patient's insulin gtt was abruptly discontinued in the night, after her CBG improved, w/o regard for her bicarb or anion gap (no labs obtained).  I am attempting to keep her off the gtt by giving her a low dose long acting insulin now, and dosing w/ SSI as well.  A stat BMET does fortunately reveal that her anion gap has normalized, and her bicarb is approaching normal.    The pt reports that she is feeling much better in general.  She denies current cp, n/v, sob, or abdom pain.    Assessment & Plan:  DKA in DM1 A1c 10.8% July 2018 - needs extensive DM education - at HIGH risk for recurrent DKA/death due to poor understanding of DM and tx - warned her about absolute need to keep her DM under consistent control - follow w/ SSI today, after 1 time does of lantus - DM coordinator to see - if CBG remains controlled, and we can assure she is able to obtain her insulin and understands proper dosing and monitoring, she may be a candidate for d/c 3/19  Tobacco abuse I warned the pt that she ABSOLUTELY MUST stop smoking, and that in the case of diabetes smoking is virtually guaranteed to lead to heart atacks, strokes, amputations, and other vascular disease  DVT prophylaxis: lovenox Code Status: FULL CODE Family Communication: no family present at time of exam  Disposition Plan: possible D/C 3/19 as noted above   Consultants:  none  Antimicrobials:  none     Objective: Blood pressure 101/60, pulse 67, temperature 98.2 F (36.8 C), temperature source Oral, resp. rate 18, height 5\' 4"  (1.626 m), weight 67.4 kg (148 lb 9.4 oz), last menstrual period 04/04/2017, SpO2 98 %.  Intake/Output Summary (Last 24 hours) at 05/02/2017 1059 Last data filed at 05/02/2017 0930 Gross per 24 hour  Intake 1828.86 ml  Output -  Net 1828.86 ml   Filed Weights   05/01/17 1637 05/02/17 0138  Weight: 72.6 kg (160 lb) 67.4 kg (148 lb 9.4 oz)    Examination: General: No acute respiratory distress Lungs: Clear to auscultation bilaterally without wheezes or crackles Cardiovascular: Regular rate and rhythm without murmur gallop or rub normal S1 and S2 Abdomen: Nontender, nondistended, soft, bowel sounds positive, no rebound, no ascites, no appreciable mass Extremities: No significant cyanosis, clubbing, or edema bilateral lower extremities  CBC: Recent Labs  Lab 05/01/17 1705  WBC 6.7  HGB 13.0  HCT 42.2  MCV 81.0  PLT 337   Basic Metabolic Panel: Recent Labs  Lab 05/01/17 1705 05/02/17 0913  NA 129* 134*  K 4.8 4.1  CL 95* 106  CO2 17* 19*  GLUCOSE 480* 229*  BUN 12 9  CREATININE 0.88 0.57  CALCIUM 9.9 8.2*   GFR: Estimated Creatinine Clearance: 103.2 mL/min (by C-G formula based on SCr of 0.57 mg/dL).  Liver Function Tests: Recent Labs  Lab 05/01/17 1705  AST  20  ALT 11*  ALKPHOS 62  BILITOT 1.1  PROT 8.8*  ALBUMIN 4.7    HbA1C: Hemoglobin A1C  Date/Time Value Ref Range Status  06/14/2012 11:08 AM >14  Final    Comment:    supposed to be checking 5x day  04/12/2012 11:21 AM 14+  Final    Comment:    chks sugar 8x day   Hgb A1c MFr Bld  Date/Time Value Ref Range Status  07/28/2013 11:14 PM 14.0 (H) <5.7 % Final    Comment:    (NOTE)                                                                       According to the ADA Clinical Practice Recommendations for 2011, when HbA1c is used as a screening test:  >=6.5%    Diagnostic of Diabetes Mellitus           (if abnormal result is confirmed) 5.7-6.4%   Increased risk of developing Diabetes Mellitus References:Diagnosis and Classification of Diabetes Mellitus,Diabetes Care,2011,34(Suppl 1):S62-S69 and Standards of Medical Care in         Diabetes - 2011,Diabetes Care,2011,34 (Suppl 1):S11-S61.  03/21/2012 06:17 PM 13.9 (H) <5.7 % Final    Comment:    RESULT CALLED TO, READ BACK BY AND VERIFIED WITH: T MILAN,RN AT 1610 03/22/12 BY K BARR (NOTE)                                                                       According to the ADA Clinical Practice Recommendations for 2011, when HbA1c is used as a screening test:  >=6.5%   Diagnostic of Diabetes Mellitus           (if abnormal result is confirmed) 5.7-6.4%   Increased risk of developing Diabetes Mellitus References:Diagnosis and Classification of Diabetes Mellitus,Diabetes Care,2011,34(Suppl 1):S62-S69 and Standards of Medical Care in         Diabetes - 2011,Diabetes Care,2011,34 (Suppl 1):S11-S61.    CBG: Recent Labs  Lab 05/02/17 0353 05/02/17 0507 05/02/17 0606 05/02/17 0703 05/02/17 0757  GLUCAP 148* 143* 104* 90 157*    Recent Results (from the past 240 hour(s))  MRSA PCR Screening     Status: None   Collection Time: 05/02/17  1:34 AM  Result Value Ref Range Status   MRSA by PCR NEGATIVE NEGATIVE Final    Comment:        The GeneXpert MRSA Assay (FDA approved for NASAL specimens only), is one component of a comprehensive MRSA colonization surveillance program. It is not intended to diagnose MRSA infection nor to guide or monitor treatment for MRSA infections. Performed at [facility], [address], [place], [place] [address]      Scheduled Meds: . enoxaparin (LOVENOX) injection  40 mg Subcutaneous Daily  . insulin glargine  10 Units Subcutaneous Daily  . loratadine  10 mg Oral Daily  . ondansetron (ZOFRAN) IV  4 mg Intravenous Once   Continuous Infusions: .  sodium chloride Stopped (05/02/17 1018)  . sodium chloride 500 mL (05/02/17 1029)  . insulin (NOVOLIN-R) infusion Stopped (05/02/17 1016)     LOS: 1 day   Lonia BloodJeffrey T. Teva Bronkema, MD [facility] Office  [phone] Pager - Text Page per Amion as per below:  On-Call/Text Page:      [email or web address]      password [email or web address]  If 7PM-7AM, please contact night-coverage [email or web address] Password [email or web address] 05/02/2017, 10:59 AM

## 2017-05-03 DIAGNOSIS — Z720 Tobacco use: Secondary | ICD-10-CM

## 2017-05-03 DIAGNOSIS — E1010 Type 1 diabetes mellitus with ketoacidosis without coma: Principal | ICD-10-CM

## 2017-05-03 LAB — COMPREHENSIVE METABOLIC PANEL
ALBUMIN: 3.3 g/dL — AB (ref 3.5–5.0)
ALK PHOS: 46 U/L (ref 38–126)
ALT: 9 U/L — ABNORMAL LOW (ref 14–54)
ANION GAP: 10 (ref 5–15)
AST: 14 U/L — AB (ref 15–41)
BILIRUBIN TOTAL: 0.9 mg/dL (ref 0.3–1.2)
BUN: 9 mg/dL (ref 6–20)
CO2: 19 mmol/L — ABNORMAL LOW (ref 22–32)
Calcium: 8.8 mg/dL — ABNORMAL LOW (ref 8.9–10.3)
Chloride: 104 mmol/L (ref 101–111)
Creatinine, Ser: 0.58 mg/dL (ref 0.44–1.00)
GFR calc Af Amer: >60 mL/min (ref >60)
GFR calc non Af Amer: >60 mL/min (ref >60)
GLUCOSE: 338 mg/dL — AB (ref 65–99)
POTASSIUM: 3.8 mmol/L (ref 3.5–5.1)
Sodium: 133 mmol/L — ABNORMAL LOW (ref 135–145)
TOTAL PROTEIN: 6.5 g/dL (ref 6.5–8.1)

## 2017-05-03 LAB — GLUCOSE, CAPILLARY
GLUCOSE-CAPILLARY: 300 mg/dL — AB (ref 65–99)
GLUCOSE-CAPILLARY: 262 mg/dL — AB (ref 65–99)
Glucose-Capillary: 207 mg/dL — ABNORMAL HIGH (ref 65–99)
Glucose-Capillary: 242 mg/dL — ABNORMAL HIGH (ref 65–99)
Glucose-Capillary: 358 mg/dL — ABNORMAL HIGH (ref 65–99)

## 2017-05-03 NOTE — Progress Notes (Signed)
Spoke with patient in regards to wanting to leave.  She is not happy but no specific concerns. She know longer feels she needs to be here. IV D/c'd no other concerns voiced.

## 2017-05-03 NOTE — Progress Notes (Signed)
Patient signed out AMA. MD aware.

## 2017-05-03 NOTE — Progress Notes (Signed)
Inpatient Diabetes Program Recommendations  AACE/ADA: New Consensus Statement on Inpatient Glycemic Control (2015)  Target Ranges:  Prepandial:   less than 140 mg/dL      Peak postprandial:   less than 180 mg/dL (1-2 hours)      Critically ill patients:  140 - 180 mg/dL   Lab Results  Component Value Date   GLUCAP 300 (H) 05/03/2017   HGBA1C 14.0 (H) 07/28/2013    Review of Glycemic Control  Diabetes history: DM1 Outpatient Diabetes medications: patient has only been taking Novolog sliding scale tid meal coverage Current orders for Inpatient glycemic control: Lantus 20 units bid + Novolog 3 units tid + Novolog correction tid + hs  Inpatient Diabetes Program Recommendations:   -Increase Lantus to 30 units bid -Increase Novolog meal coverage to 8 tid if eats 50%  Thank you, Darel HongJudy E. Khadeejah Castner, RN, MSN, CDE  Diabetes Coordinator Inpatient Glycemic Control Team Team Pager [phone] (8am-5pm) 05/03/2017 10:34 AM

## 2017-05-03 NOTE — Discharge Summary (Signed)
Physician Discharge Summary  Emily OdorShadae [name] ZOX:[id number] DOB: [birth year] DOA: 05/01/2017  PCP: [facility]  Admit date: 05/01/2017 Discharge date: 05/03/2017  Time spent: 35 minutes  Recommendations for Outpatient Follow-up:  DM Type 1 uncontrolled with complication/DKA -July 2018 Hemoglobin A1c= 10.8 -patient with currently uncontrolled CBG counseled  t high risk of rebound DKA, DEATH. checked out AMA   DKA in D   Tobacco abuse -Counseled must absolutely stop smoking -checked out AMA      Discharge Diagnoses:  Principal Problem:   DKA (diabetic ketoacidosis) (HCC) Active Problems:   Type 1 diabetes mellitus not at goal [facility](HCC)   Hyponatremia   Discharge Condition: guarded  Diet recommendation: American diabetic Association  Filed Weights   05/01/17 1637 05/02/17 0138  Weight: 160 lb (72.6 kg) 148 lb 9.4 oz (67.4 kg)    History of present illness:  22 y.o.BF PMHx DM type 1, Anxiety ,Tobacco abuse, Asthma, Learning disability, Lupus   Presenting to the emergency department for evaluation of polydipsia, polyuria, and abdominal discomfort.  Patient reports that she had been in her usual state of health until approximately 2 days ago when she noted polyuria and polydipsia.  Since that time, the symptoms have progressed and she has also developed some mild abdominal discomfort.  She denies fevers, chills, chest pain, cough, or shortness of breath.  Denies dysuria or flank pain.  Reports that she has run out of testing supplies, and reports that she had been well controlled while managed with insulin pump, but is no longer followed by endocrinology and seems unclear about her new insulin prescription.   ED Course: Upon arrival to the ED, patient is found to be afebrile, saturating well on room air, and with vitals otherwise normal.  Chemistry panel is notable for a sodium of 129, bicarbonate 17, anion gap 17, and serum glucose 480.  CBC is unremarkable.  Urinalysis is  notable for glucosuria and ketonuria.  Patient was given a liter of normal saline in the emergency department and started on insulin infusion.  She remains hemodynamically stable, in no apparent respiratory distress, and will be admitted to the stepdown unit for ongoing evaluation and management of diabetic ketoacidosis.  During his hospitalization patient referred DKA. Unfortunately patient did not ish to remain in order to optimize her blood sugar control. Therefore checked out AMA. Patient had been counseled that she was at high risk of rebounding into DKA.     Discharge Exam: Vitals:   05/02/17 2041 05/02/17 2300 05/03/17 0300 05/03/17 0731  BP: 102/67 117/71 108/61 103/63  Pulse: 78 81 (!) 53 62  Resp: 15 14 17 19   Temp: 98.3 F (36.8 C) 98.4 F (36.9 C) 98 F (36.7 C) 98.1 F (36.7 C)  TempSrc: Oral Oral Oral Oral  SpO2: 100% 100% 100% 100%  Weight:      Height:        General: A/O 4,negative acute respiratory distress Cardiovascular: regular rhythm and rate, negative murmurs rubs gallops, normal S1/S2 Respiratory: clear to auscultation bilateral, negative wheezes, negative crackles  Discharge Instructions   Allergies as of 05/03/2017   No Known Allergies     Medication List    ASK your doctor about these medications   aspirin-acetaminophen-caffeine 250-250-65 MG tablet Commonly known as:  EXCEDRIN MIGRAINE Take 2 tablets by mouth every 6 (six) hours as needed for headache or migraine.   BLOOD GLUCOSE METER DISPOSABLE Devi Use as directed   insulin aspart 100 UNIT/ML FlexPen Commonly known as:  NOVOLOG Inject 10 Units into the skin 3 (three) times daily with meals.   Insulin Pen Needle 31G X 5 MM Misc Use with insulin pen   Pen Needles 3/16" 31G X 5 MM Misc Use as directed   loratadine 10 MG tablet Commonly known as:  CLARITIN Take 1 tablet (10 mg total) by mouth daily.      No Known Allergies    The results of significant diagnostics from this  hospitalization (including imaging, microbiology, ancillary and laboratory) are listed below for reference.    Significant Diagnostic Studies: No results found.  Microbiology: Recent Results (from the past 240 hour(s))  MRSA PCR Screening     Status: None   Collection Time: 05/02/17  1:34 AM  Result Value Ref Range Status   MRSA by PCR NEGATIVE NEGATIVE Final    Comment:        The GeneXpert MRSA Assay (FDA approved for NASAL specimens only), is one component of a comprehensive MRSA colonization surveillance program. It is not intended to diagnose MRSA infection nor to guide or monitor treatment for MRSA infections. Performed at [facility], [address], [place], [place] [address]      Labs: Basic Metabolic Panel: Recent Labs  Lab 05/01/17 1705 05/02/17 0913 05/02/17 1432 05/03/17 0653  NA 129* 134* 132* 133*  K 4.8 4.1 4.1 3.8  CL 95* 106 102 104  CO2 17* 19* 18* 19*  GLUCOSE 480* 229* 470* 338*  BUN 12 9 11 9   CREATININE 0.88 0.57 0.75 0.58  CALCIUM 9.9 8.2* 8.8* 8.8*   Liver Function Tests: Recent Labs  Lab 05/01/17 1705 05/03/17 0653  AST 20 14*  ALT 11* 9*  ALKPHOS 62 46  BILITOT 1.1 0.9  PROT 8.8* 6.5  ALBUMIN 4.7 3.3*   No results for input(s): LIPASE, AMYLASE in the last 168 hours. No results for input(s): AMMONIA in the last 168 hours. CBC: Recent Labs  Lab 05/01/17 1705  WBC 6.7  HGB 13.0  HCT 42.2  MCV 81.0  PLT 337   Cardiac Enzymes: No results for input(s): CKTOTAL, CKMB, CKMBINDEX, TROPONINI in the last 168 hours. BNP: BNP (last 3 results) No results for input(s): BNP in the last 8760 hours.  ProBNP (last 3 results) No results for input(s): PROBNP in the last 8760 hours.  CBG: Recent Labs  Lab 05/03/17 0017 05/03/17 0410 05/03/17 0730 05/03/17 1045 05/03/17 1240  GLUCAP 242* 358* 300* 262* 207*       Signed:  Carolyne Littles, MD [facility] [phone] pager

## 2021-07-26 ENCOUNTER — Emergency Department (HOSPITAL_COMMUNITY): Payer: Medicaid Other

## 2021-07-26 ENCOUNTER — Observation Stay (HOSPITAL_COMMUNITY): Payer: Medicaid Other

## 2021-07-26 ENCOUNTER — Encounter (HOSPITAL_COMMUNITY): Payer: Self-pay

## 2021-07-26 ENCOUNTER — Other Ambulatory Visit: Payer: Self-pay

## 2021-07-26 ENCOUNTER — Inpatient Hospital Stay (HOSPITAL_COMMUNITY)
Admission: EM | Admit: 2021-07-26 | Discharge: 2021-07-29 | DRG: 438 | Disposition: A | Discharge status: Home / Self Care | Payer: Medicaid Other | Attending: Student | Admitting: Student

## 2021-07-26 DIAGNOSIS — M329 Systemic lupus erythematosus, unspecified: Secondary | ICD-10-CM | POA: Diagnosis present

## 2021-07-26 DIAGNOSIS — Z79899 Other long term (current) drug therapy: Secondary | ICD-10-CM

## 2021-07-26 DIAGNOSIS — Z6830 Body mass index (BMI) 30.0-30.9, adult: Secondary | ICD-10-CM

## 2021-07-26 DIAGNOSIS — E669 Obesity, unspecified: Secondary | ICD-10-CM | POA: Diagnosis present

## 2021-07-26 DIAGNOSIS — E7800 Pure hypercholesterolemia, unspecified: Secondary | ICD-10-CM | POA: Diagnosis present

## 2021-07-26 DIAGNOSIS — E049 Nontoxic goiter, unspecified: Secondary | ICD-10-CM | POA: Diagnosis present

## 2021-07-26 DIAGNOSIS — E1042 Type 1 diabetes mellitus with diabetic polyneuropathy: Secondary | ICD-10-CM | POA: Diagnosis present

## 2021-07-26 DIAGNOSIS — K219 Gastro-esophageal reflux disease without esophagitis: Secondary | ICD-10-CM

## 2021-07-26 DIAGNOSIS — Z825 Family history of asthma and other chronic lower respiratory diseases: Secondary | ICD-10-CM

## 2021-07-26 DIAGNOSIS — E871 Hypo-osmolality and hyponatremia: Secondary | ICD-10-CM | POA: Diagnosis present

## 2021-07-26 DIAGNOSIS — E041 Nontoxic single thyroid nodule: Secondary | ICD-10-CM

## 2021-07-26 DIAGNOSIS — Z91148 Patient's other noncompliance with medication regimen for other reason: Secondary | ICD-10-CM

## 2021-07-26 DIAGNOSIS — Z794 Long term (current) use of insulin: Secondary | ICD-10-CM

## 2021-07-26 DIAGNOSIS — J45909 Unspecified asthma, uncomplicated: Secondary | ICD-10-CM | POA: Diagnosis present

## 2021-07-26 DIAGNOSIS — R112 Nausea with vomiting, unspecified: Secondary | ICD-10-CM

## 2021-07-26 DIAGNOSIS — E1010 Type 1 diabetes mellitus with ketoacidosis without coma: Secondary | ICD-10-CM | POA: Diagnosis not present

## 2021-07-26 DIAGNOSIS — K8500 Idiopathic acute pancreatitis without necrosis or infection: Principal | ICD-10-CM | POA: Diagnosis present

## 2021-07-26 DIAGNOSIS — R748 Abnormal levels of other serum enzymes: Secondary | ICD-10-CM | POA: Insufficient documentation

## 2021-07-26 DIAGNOSIS — N179 Acute kidney failure, unspecified: Secondary | ICD-10-CM

## 2021-07-26 DIAGNOSIS — E1142 Type 2 diabetes mellitus with diabetic polyneuropathy: Secondary | ICD-10-CM | POA: Diagnosis present

## 2021-07-26 DIAGNOSIS — K859 Acute pancreatitis without necrosis or infection, unspecified: Secondary | ICD-10-CM | POA: Diagnosis present

## 2021-07-26 DIAGNOSIS — Z9641 Presence of insulin pump (external) (internal): Secondary | ICD-10-CM | POA: Diagnosis present

## 2021-07-26 DIAGNOSIS — Z833 Family history of diabetes mellitus: Secondary | ICD-10-CM

## 2021-07-26 DIAGNOSIS — F319 Bipolar disorder, unspecified: Secondary | ICD-10-CM | POA: Diagnosis present

## 2021-07-26 DIAGNOSIS — E876 Hypokalemia: Secondary | ICD-10-CM

## 2021-07-26 DIAGNOSIS — E1110 Type 2 diabetes mellitus with ketoacidosis without coma: Secondary | ICD-10-CM | POA: Diagnosis present

## 2021-07-26 DIAGNOSIS — F819 Developmental disorder of scholastic skills, unspecified: Secondary | ICD-10-CM | POA: Diagnosis present

## 2021-07-26 DIAGNOSIS — F17210 Nicotine dependence, cigarettes, uncomplicated: Secondary | ICD-10-CM | POA: Diagnosis present

## 2021-07-26 DIAGNOSIS — E861 Hypovolemia: Secondary | ICD-10-CM | POA: Diagnosis present

## 2021-07-26 DIAGNOSIS — E785 Hyperlipidemia, unspecified: Secondary | ICD-10-CM

## 2021-07-26 LAB — BASIC METABOLIC PANEL
Anion gap: 22 — ABNORMAL HIGH (ref 5–15)
BUN: 12 mg/dL (ref 6–20)
BUN: 15 mg/dL (ref 6–20)
CO2: 8 mmol/L — ABNORMAL LOW (ref 22–32)
CO2: <7 mmol/L — ABNORMAL LOW (ref 22–32)
Calcium: 9.7 mg/dL (ref 8.9–10.3)
Calcium: 9.5 mg/dL (ref 8.9–10.3)
Chloride: 102 mmol/L (ref 98–111)
Chloride: 101 mmol/L (ref 98–111)
Creatinine, Ser: 1.22 mg/dL — ABNORMAL HIGH (ref 0.44–1.00)
Creatinine, Ser: 1.35 mg/dL — ABNORMAL HIGH (ref 0.44–1.00)
GFR, Estimated: >60 mL/min (ref >60)
GFR, Estimated: 55 mL/min — ABNORMAL LOW (ref >60)
Glucose, Bld: 432 mg/dL — ABNORMAL HIGH (ref 70–99)
Glucose, Bld: 588 mg/dL (ref 70–99)
Potassium: 4.2 mmol/L (ref 3.5–5.1)
Potassium: 5.3 mmol/L — ABNORMAL HIGH (ref 3.5–5.1)
Sodium: 132 mmol/L — ABNORMAL LOW (ref 135–145)
Sodium: 134 mmol/L — ABNORMAL LOW (ref 135–145)

## 2021-07-26 LAB — CBG MONITORING, ED
Glucose-Capillary: 416 mg/dL — ABNORMAL HIGH (ref 70–99)
Glucose-Capillary: 525 mg/dL (ref 70–99)
Glucose-Capillary: 512 mg/dL (ref 70–99)
Glucose-Capillary: 554 mg/dL (ref 70–99)
Glucose-Capillary: 586 mg/dL (ref 70–99)
Glucose-Capillary: 497 mg/dL — ABNORMAL HIGH (ref 70–99)
Glucose-Capillary: 521 mg/dL (ref 70–99)
Glucose-Capillary: 408 mg/dL — ABNORMAL HIGH (ref 70–99)
Glucose-Capillary: 440 mg/dL — ABNORMAL HIGH (ref 70–99)
Glucose-Capillary: 383 mg/dL — ABNORMAL HIGH (ref 70–99)
Glucose-Capillary: 245 mg/dL — ABNORMAL HIGH (ref 70–99)
Glucose-Capillary: 238 mg/dL — ABNORMAL HIGH (ref 70–99)

## 2021-07-26 LAB — I-STAT VENOUS BLOOD GAS, ED
Acid-base deficit: 23 mmol/L — ABNORMAL HIGH (ref 0.0–2.0)
Bicarbonate: 5.7 mmol/L — ABNORMAL LOW (ref 20.0–28.0)
Calcium, Ion: 1.24 mmol/L (ref 1.15–1.40)
HCT: 42 % (ref 36.0–46.0)
Hemoglobin: 14.3 g/dL (ref 12.0–15.0)
O2 Saturation: 86 %
Potassium: 4.9 mmol/L (ref 3.5–5.1)
Sodium: 135 mmol/L (ref 135–145)
TCO2: 6 mmol/L — ABNORMAL LOW (ref 22–32)
pCO2, Ven: 19.4 mmHg — CL (ref 44–60)
pH, Ven: 7.074 — CL (ref 7.25–7.43)
pO2, Ven: 69 mmHg — ABNORMAL HIGH (ref 32–45)

## 2021-07-26 LAB — URINALYSIS, ROUTINE W REFLEX MICROSCOPIC
Bacteria, UA: NONE SEEN
Bilirubin Urine: NEGATIVE
Glucose, UA: >=500 mg/dL — AB
Ketones, ur: 80 mg/dL — AB
Leukocytes,Ua: NEGATIVE
Nitrite: NEGATIVE
Protein, ur: 30 mg/dL — AB
Specific Gravity, Urine: 1.028 (ref 1.005–1.030)
pH: 5 (ref 5.0–8.0)

## 2021-07-26 LAB — LIPASE, BLOOD: Lipase: 791 U/L — ABNORMAL HIGH (ref 11–51)

## 2021-07-26 LAB — HEPATIC FUNCTION PANEL
ALT: 13 U/L (ref 0–44)
AST: 13 U/L — ABNORMAL LOW (ref 15–41)
Albumin: 3.9 g/dL (ref 3.5–5.0)
Alkaline Phosphatase: 81 U/L (ref 38–126)
Bilirubin, Direct: 0.1 mg/dL (ref 0.0–0.2)
Indirect Bilirubin: 1.1 mg/dL — ABNORMAL HIGH (ref 0.3–0.9)
Total Bilirubin: 1.2 mg/dL (ref 0.3–1.2)
Total Protein: 8.6 g/dL — ABNORMAL HIGH (ref 6.5–8.1)

## 2021-07-26 LAB — BETA-HYDROXYBUTYRIC ACID: Beta-Hydroxybutyric Acid: >8 mmol/L — ABNORMAL HIGH (ref 0.05–0.27)

## 2021-07-26 LAB — CBC
HCT: 37.1 % (ref 36.0–46.0)
Hemoglobin: 11.6 g/dL — ABNORMAL LOW (ref 12.0–15.0)
MCH: 26 pg (ref 26.0–34.0)
MCHC: 31.3 g/dL (ref 30.0–36.0)
MCV: 83 fL (ref 80.0–100.0)
Platelets: 298 10*3/uL (ref 150–400)
RBC: 4.47 MIL/uL (ref 3.87–5.11)
RDW: 15.4 % (ref 11.5–15.5)
WBC: 9.8 10*3/uL (ref 4.0–10.5)
nRBC: 0 % (ref 0.0–0.2)

## 2021-07-26 LAB — I-STAT BETA HCG BLOOD, ED (MC, WL, AP ONLY): I-stat hCG, quantitative: <5 m[IU]/mL (ref <5)

## 2021-07-26 LAB — HEMOGLOBIN A1C
Hgb A1c MFr Bld: 14.9 % — ABNORMAL HIGH (ref 4.8–5.6)
Mean Plasma Glucose: 380.93 mg/dL

## 2021-07-26 MED ORDER — HYDROMORPHONE HCL 1 MG/ML IJ SOLN
1.0000 mg | Freq: Once | INTRAMUSCULAR | Status: AC
  Administered 2021-07-26: 1 mg via INTRAVENOUS
  Filled 2021-07-26: qty 1

## 2021-07-26 MED ORDER — LACTATED RINGERS IV BOLUS
20.0000 mL/kg | Freq: Once | INTRAVENOUS | Status: AC
  Administered 2021-07-26: 1600 mL via INTRAVENOUS

## 2021-07-26 MED ORDER — MORPHINE SULFATE (PF) 2 MG/ML IV SOLN: 1.0000 mg | INTRAVENOUS | Status: DC | PRN

## 2021-07-26 MED ORDER — ACETAMINOPHEN 325 MG PO TABS: 650.0000 mg | ORAL_TABLET | Freq: Four times a day (QID) | ORAL | Status: DC | PRN

## 2021-07-26 MED ORDER — DEXTROSE 50 % IV SOLN: 0.0000 mL | INTRAVENOUS | Status: DC | PRN

## 2021-07-26 MED ORDER — POTASSIUM CHLORIDE 10 MEQ/100ML IV SOLN
10.0000 meq | INTRAVENOUS | Status: AC
  Administered 2021-07-26 (×2): 10 meq via INTRAVENOUS
  Filled 2021-07-26 (×2): qty 100

## 2021-07-26 MED ORDER — ALBUTEROL SULFATE (2.5 MG/3ML) 0.083% IN NEBU: 2.5000 mg | INHALATION_SOLUTION | Freq: Four times a day (QID) | RESPIRATORY_TRACT | Status: DC | PRN

## 2021-07-26 MED ORDER — INSULIN REGULAR(HUMAN) IN NACL 100-0.9 UT/100ML-% IV SOLN
INTRAVENOUS | Status: DC
Start: 2021-07-26 — End: 2021-07-27
  Administered 2021-07-26: 9.5 [IU]/h via INTRAVENOUS
  Administered 2021-07-27: 14 [IU]/h via INTRAVENOUS
  Filled 2021-07-26 (×2): qty 100

## 2021-07-26 MED ORDER — LACTATED RINGERS IV BOLUS
1000.0000 mL | Freq: Once | INTRAVENOUS | Status: AC
  Administered 2021-07-26: 1000 mL via INTRAVENOUS

## 2021-07-26 MED ORDER — METOCLOPRAMIDE HCL 5 MG/ML IJ SOLN
5.0000 mg | Freq: Four times a day (QID) | INTRAMUSCULAR | Status: DC | PRN
  Administered 2021-07-26 – 2021-07-27 (×3): 5 mg via INTRAVENOUS
  Filled 2021-07-26 (×3): qty 2

## 2021-07-26 MED ORDER — ONDANSETRON HCL 4 MG/2ML IJ SOLN
4.0000 mg | Freq: Three times a day (TID) | INTRAMUSCULAR | Status: DC | PRN
  Filled 2021-07-26: qty 2

## 2021-07-26 MED ORDER — LACTATED RINGERS IV SOLN: INTRAVENOUS | Status: DC

## 2021-07-26 MED ORDER — DEXTROSE IN LACTATED RINGERS 5 % IV SOLN: INTRAVENOUS | Status: DC

## 2021-07-26 MED ORDER — ACETAMINOPHEN 650 MG RE SUPP: 650.0000 mg | Freq: Four times a day (QID) | RECTAL | Status: DC | PRN

## 2021-07-26 MED ORDER — PANTOPRAZOLE SODIUM 40 MG IV SOLR
40.0000 mg | INTRAVENOUS | Status: DC
  Administered 2021-07-26 – 2021-07-28 (×3): 40 mg via INTRAVENOUS
  Filled 2021-07-26 (×3): qty 10

## 2021-07-26 NOTE — ED Notes (Signed)
Phleb asked to attempt for blood draw due to multiple unsuccessful attempts by RN's and IV team.

## 2021-07-26 NOTE — ED Notes (Signed)
IV team unsuccessful. MD Rogers Blocker made aware.

## 2021-07-26 NOTE — Assessment & Plan Note (Signed)
Continue PPI>change to IV protonix

## 2021-07-26 NOTE — Assessment & Plan Note (Signed)
Lipase to 791 Her pain is not characteristic of pancreatitis as she has diffuse generalized pain and no radiation to the back Does have N/V Awaiting CT scan of her abdomen  Has had aggressive IVF NPO Continue pain management Checking lipid panel No hx of drinking No history of fever/chills, leukocytosis

## 2021-07-26 NOTE — ED Provider Notes (Signed)
Hospitalist service (A. Artis Flock) is aware of case and will evaluate for admission.   Wynetta Fines, MD 07/26/21 [phone]

## 2021-07-26 NOTE — H&P (Signed)
History and Physical    Patient: [name] XKG:[id number] DOB: [birth year] DOA: 07/26/2021 DOS: the patient was seen and examined on 07/26/2021 PCP: Default, Provider, MD  Patient coming from: Home - lives with her daughter    Chief Complaint: N/V   HPI: [name] is a 26 y.o. female with medical history significant of T1DM, autoimmune thyroiditis, learning disability, bipolar and depression who presented to ED with complaints of N/V. She went over to her moms and forgot her insulin pump for the day yesterday. She started to get nauseated with vomiting that started early this AM. She states she has been having episodes of vomiting over every time she eats she throws up that has been going on for a few years. She has generalized stomach pain rated as a 8/10 and described as sharp. No radiation. She does not drink alcohol. She does have a hx of high cholesterol. She has appointment with endocrinologist later this month.   Denies any fever/chills, vision changes/headaches, chest pain or palpitations,  cough, diarrhea,  dysuria or leg swelling. She denies any open wounds.   She does not smoke or drink alcohol.    ER Course:  vitals: afebrile, bp: 135/88, HR: 113, RR: 14, oxygen: 100%RA Pertinent labs: glucose: 432, creatinine: 1.22, AG: 22, UA: 80 ketones, lipase: 791,  pH: 7.074, CO2: 8,  In ED: started on DKA protocol/drip. Given appropriate fluids/potassium per endo tool. CT abdomen/pelvis ordered.     Review of Systems: As mentioned in the history of present illness. All other systems reviewed and are negative. Past Medical History:  Diagnosis Date   Allergy    dust    Anxiety    Asthma    Diabetes mellitus    Insulin Dependant   Learning disability    Lupus (HCC)    Urinary tract infection    Vision abnormalities    wears glasses   Past Surgical History:  Procedure Laterality Date   NO PAST SURGERIES     Social History:  reports that she has been smoking cigarettes.  She started smoking about 8 years ago. She has been smoking an average of .5 packs per day. She has never used smokeless tobacco. She reports that she does not drink alcohol and does not use drugs.  No Known Allergies  Family History  Adopted: Yes  Problem Relation Age of Onset   Early death Sister    Asthma Maternal Aunt    Depression Maternal Aunt    Mental illness Maternal Aunt    Asthma Maternal Grandmother    Thyroid disease Maternal Grandmother    Obesity Maternal Grandmother    Heart disease Neg Hx    Diabetes Neg Hx     Prior to Admission medications   Medication Sig Start Date End Date Taking? Authorizing Provider  gabapentin (NEURONTIN) 300 MG capsule Take 300 mg by mouth at bedtime. 02/20/21  Yes [provider]  insulin aspart (NOVOLOG) 100 UNIT/ML injection Inject 0-60 Units into the skin 3 (three) times daily as needed for high blood sugar. 07/10/21  Yes [provider]  medroxyPROGESTERone (DEPO-PROVERA) 150 MG/ML injection Inject 150 mg into the muscle every 3 (three) months. 05/25/21  Yes [provider]  omeprazole (PRILOSEC) 40 MG capsule Take 40 mg by mouth in the morning and at bedtime. 05/14/21  Yes [provider]  Blood Gluc Meter Disp-Strips (BLOOD GLUCOSE METER DISPOSABLE) DEVI Use as directed 07/30/13   Hollice Espy, MD  insulin aspart (NOVOLOG) 100 UNIT/ML  FlexPen Inject 10 Units into the skin 3 (three) times daily with meals. Patient not taking: Reported on 07/26/2021 07/30/13   Hollice EspyKrishnan, Sendil K, MD  Insulin Pen Needle (PEN NEEDLES 3/16") 31G X 5 MM MISC Use as directed 07/30/13   Hollice EspyKrishnan, Sendil K, MD  Insulin Pen Needle 31G X 5 MM MISC Use with insulin pen 08/17/12   David StallBrennan, Michael J, MD  loratadine (CLARITIN) 10 MG tablet Take 1 tablet (10 mg total) by mouth daily. Patient not taking: Reported on 07/26/2021 07/30/13   Hollice EspyKrishnan, Sendil K, MD  pravastatin (PRAVACHOL) 40 MG tablet Take 40 mg by mouth daily. 07/14/21    [provider]    Physical Exam: Vitals:   07/26/21 1442 07/26/21 1445 07/26/21 1500 07/26/21 1545  BP: (!) 150/79 124/77 (!) 151/82 140/68  Pulse:  (!) 107 (!) 110 (!) 116  Resp: (!) 24 (!) 23 (!) 25 (!) 23  Temp:      TempSrc:      SpO2: 98% 100% 100% 100%  Weight:   80 kg    General:  Appears calm and comfortable and is in NAD. Alert, daughter on bed.  Eyes:  PERRL, EOMI, normal lids, iris ENT:  grossly normal hearing, lips & tongue, dry mucous membranes; appropriate dentition Neck:  no LAD, masses or thyroid goiter; no carotid bruits Cardiovascular:  sinus tachycardia, regular rhythm, no m/r/g. No LE edema.  Respiratory:   CTA bilaterally with no wheezes/rales/rhonchi.  Normal respiratory effort. Abdomen:  soft, generalized TTP, ND, NABS. No rebound or guarding  Back:   normal alignment, no CVAT Skin:  no rash or induration seen on limited exam Musculoskeletal:  grossly normal tone BUE/BLE, good ROM, no bony abnormality Lower extremity:  No LE edema.  Limited foot exam with no ulcerations.  2+ distal pulses. Psychiatric:  grossly normal mood and affect, speech fluent and appropriate, AOx3 Neurologic:  CN 2-12 grossly intact, moves all extremities in coordinated fashion, sensation intact   Radiological Exams on Admission: Independently reviewed - see discussion in A/P where applicable  DG CHEST PORT 1 VIEW  Result Date: 07/26/2021 CLINICAL DATA:  Abdominal pain and vomiting. EXAM: PORTABLE CHEST 1 VIEW COMPARISON:  March 25, 2017 FINDINGS: The heart size and mediastinal contours are within normal limits. Both lungs are clear. The visualized skeletal structures are unremarkable. IMPRESSION: No active disease. Electronically Signed   By: Aram Candelahaddeus  Houston M.D.   On: 07/26/2021 16:34    EKG: pending    Labs on Admission: I have personally reviewed the available labs and imaging studies at the time of the admission.  Pertinent labs:   glucose: 432,  creatinine:  1.22,  AG: 22,  UA: 80 ketones,  lipase: 791,  pH: 7.074,  CO2: 8,   Assessment and Plan: Principal Problem:   Diabetic ketoacidosis in type 1 diabetic  Active Problems:   Elevated lipase   AKI (acute kidney injury) (HCC)   Goiter/hx of thyroiditis   Diabetic peripheral neuropathy (HCC)   GERD (gastroesophageal reflux disease)   Hyperlipidemia   Nausea and vomiting    Assessment and Plan: * Diabetic ketoacidosis in type 1 diabetic  26 year old type 1 diabetic presenting to ED with complaints of N/V and not taking her insulin x  1 day  found to be in moderate- severe DKA.  -observation to progressive with DKA protocol  -Patient with poor baseline control: A1C of 12.4 in 03/2021, will update A1C here  -has not taken her insulin x  1 day in setting of chronic N/V and elevated lipase -no infectious etiology at this time, awaiting CT abdomen -Would recommend continuing insulin drip at least until morning regardless of rapidity of closure of gap and normalization of labs -IVF at 150 cc/hr, LR until glucose <250 and then decrease rate to 125 and change to D5LR -diabetic education    Elevated lipase Lipase to 791 Her pain is not characteristic of pancreatitis as she has diffuse generalized pain and no radiation to the back Does have N/V Awaiting CT scan of her abdomen  Has had aggressive IVF NPO Continue pain management Checking lipid panel No hx of drinking No history of fever/chills, leukocytosis   AKI (acute kidney injury) (HCC) Mild bump of creatinine to 1.22 in setting of DKA and hypovolemia Aggressive IVF and then maintenance UA with no hgb  Strict I/O Avoid nephrotoxic drugs Expect to correct quickly with IVF trend  Nausea and vomiting History of nausea and vomiting for years ? If due to gastroparesis from poorly controlled diabetes.  Will try reglan and will need outpatient GI visit   Hyperlipidemia Repeat lipid panel in AM to check TG  Hold statin until  hepatic panel results   GERD (gastroesophageal reflux disease) Continue PPI>change to IV protonix   Diabetic peripheral neuropathy (HCC) Continue gabapentin   Goiter/hx of thyroiditis States it's hard for her to swallow due to enlarged goiter Not significant on exam Thyroid US in 2018 read as unremarkable thyroid US, no others in chart Check TSH/free T4 Repeat thyroid US     Advance Care Planning:   Code Status: Full Code   Consults: SW, diabetic education   DVT Prophylaxis: SCDs  Family Communication: updated her mother by phone :Cleda Clarks [phone]  Severity of Illness: The appropriate patient status for this patient is OBSERVATION. Observation status is judged to be reasonable and necessary in order to provide the required intensity of service to ensure the patient's safety. The patient's presenting symptoms, physical exam findings, and initial radiographic and laboratory data in the context of their medical condition is felt to place them at decreased risk for further clinical deterioration. Furthermore, it is anticipated that the patient will be medically stable for discharge from the hospital within 2 midnights of admission.   Author: Orland Mustard, MD 07/26/2021 5:13 PM  For on call review [email or web address].

## 2021-07-26 NOTE — ED Notes (Signed)
Patient transported to CT with RN 

## 2021-07-26 NOTE — ED Notes (Signed)
Lab called to add on Hgb A1c

## 2021-07-26 NOTE — Assessment & Plan Note (Signed)
States it's hard for her to swallow due to enlarged goiter Not significant on exam Thyroid US in 2018 read as unremarkable thyroid US, no others in chart Check TSH/free T4 Repeat thyroid US

## 2021-07-26 NOTE — Assessment & Plan Note (Signed)
Mild bump of creatinine to 1.22 in setting of DKA and hypovolemia Aggressive IVF and then maintenance UA with no hgb  Strict I/O Avoid nephrotoxic drugs Expect to correct quickly with IVF trend

## 2021-07-26 NOTE — Assessment & Plan Note (Addendum)
26 year old type 1 diabetic presenting to ED with complaints of N/V and not taking her insulin x  1 day  found to be in moderate- severe DKA.  -observation to progressive with DKA protocol  -Patient with poor baseline control: A1C of 12.4 in 03/2021, will update A1C here  -has not taken her insulin x 1 day in setting of chronic N/V and elevated lipase -no infectious etiology at this time, awaiting CT abdomen -Would recommend continuing insulin drip at least until morning regardless of rapidity of closure of gap and normalization of labs -IVF at 150 cc/hr, LR until glucose <250 and then decrease rate to 125 and change to D5LR -diabetic education

## 2021-07-26 NOTE — ED Notes (Signed)
Lab called to add on UDS.

## 2021-07-26 NOTE — Assessment & Plan Note (Signed)
Continue gabapentin.

## 2021-07-26 NOTE — Assessment & Plan Note (Signed)
Repeat lipid panel in AM to check TG  Hold statin until hepatic panel results

## 2021-07-26 NOTE — Assessment & Plan Note (Signed)
History of nausea and vomiting for years ? If due to gastroparesis from poorly controlled diabetes.  Will try reglan and will need outpatient GI visit

## 2021-07-26 NOTE — ED Triage Notes (Signed)
EMS stated, she has not been able to keep anything on her stomach since last night and her sugar 488. Pt. Stated, sshe didn't take any of her Insulin cause she didn't have it with her.

## 2021-07-26 NOTE — ED Provider Notes (Signed)
[facility] Provider Note   CSN: [id number] Arrival date & time: 07/26/21  [id number]     History  Chief Complaint  Patient presents with   Abdominal Pain   Nausea   Emesis   Hyperglycemia    Sela HildingShadae Lily KocherGomez is a 26 y.o. female.   Abdominal Pain Associated symptoms: constipation, fatigue, nausea and vomiting   Emesis Associated symptoms: abdominal pain   Hyperglycemia Associated symptoms: abdominal pain, fatigue, nausea and vomiting   Patient presents for abdominal pain, vomiting, and p.o. intolerance.  Medical history includes T1DM, eczema, asthma, bipolar disorder.  She states that the symptoms have been ongoing for the past several weeks.  She experiences nausea and vomiting anytime she tries to eat.  She describes her pain as located in epigastrium and periumbilical.  It does radiate to her mid thoracic area of her back.  She denies any history of pancreatitis.  She states that she does not drink alcohol.  For her diabetes, she typically has an insulin pump.  This was left at her mother's home and she has not been able to use it.  She did take a subcutaneous dose of 9 units of short acting insulin this morning.  The last time she ate was last night and she did have subsequent vomiting.  She has had no bowel movements in several days and has also been experiencing decreased urinary output.  She states that the last time she was in DKA was several years ago.  Per chart review, this was in 2019.     Home Medications Prior to Admission medications   Medication Sig Start Date End Date Taking? Authorizing Provider  gabapentin (NEURONTIN) 300 MG capsule Take 300 mg by mouth at bedtime. 02/20/21  Yes [provider]  insulin aspart (NOVOLOG) 100 UNIT/ML injection Inject 0-60 Units into the skin 3 (three) times daily as needed for high blood sugar. 07/10/21  Yes [provider]  medroxyPROGESTERone (DEPO-PROVERA) 150 MG/ML injection Inject  150 mg into the muscle every 3 (three) months. 05/25/21  Yes [provider]  omeprazole (PRILOSEC) 40 MG capsule Take 40 mg by mouth in the morning and at bedtime. 05/14/21  Yes [provider]  Blood Gluc Meter Disp-Strips (BLOOD GLUCOSE METER DISPOSABLE) DEVI Use as directed 07/30/13   Hollice EspyKrishnan, Sendil K, MD  insulin aspart (NOVOLOG) 100 UNIT/ML FlexPen Inject 10 Units into the skin 3 (three) times daily with meals. Patient not taking: Reported on 07/26/2021 07/30/13   Hollice EspyKrishnan, Sendil K, MD  Insulin Pen Needle (PEN NEEDLES 3/16") 31G X 5 MM MISC Use as directed 07/30/13   Hollice EspyKrishnan, Sendil K, MD  Insulin Pen Needle 31G X 5 MM MISC Use with insulin pen 08/17/12   David StallBrennan, Michael J, MD  loratadine (CLARITIN) 10 MG tablet Take 1 tablet (10 mg total) by mouth daily. Patient not taking: Reported on 07/26/2021 07/30/13   Hollice EspyKrishnan, Sendil K, MD  pravastatin (PRAVACHOL) 40 MG tablet Take 40 mg by mouth daily. 07/14/21   [provider]      Allergies    Patient has no known allergies.    Review of Systems   Review of Systems  Constitutional:  Positive for fatigue.  Gastrointestinal:  Positive for abdominal pain, constipation, nausea and vomiting.  All other systems reviewed and are negative.   Physical Exam Updated Vital Signs BP (!) 151/82   Pulse (!) 110   Temp 98.3 F (36.8 C) (Oral)   Resp (!) 25  Wt 80 kg   LMP  (LMP Unknown)   SpO2 100%   BMI 30.27 kg/m  Physical Exam Vitals and nursing note reviewed.  Constitutional:      General: She is not in acute distress.    Appearance: She is well-developed and normal weight. She is ill-appearing. She is not toxic-appearing or diaphoretic.  HENT:     Head: Normocephalic and atraumatic.     Mouth/Throat:     Mouth: Mucous membranes are moist.     Pharynx: Oropharynx is clear.  Eyes:     Conjunctiva/sclera: Conjunctivae normal.  Cardiovascular:     Rate and Rhythm: Normal rate and regular rhythm.     Heart  sounds: No murmur heard. Pulmonary:     Effort: Pulmonary effort is normal. No respiratory distress.  Abdominal:     General: Abdomen is flat.     Palpations: Abdomen is soft.     Tenderness: There is abdominal tenderness in the epigastric area and periumbilical area. There is no guarding or rebound.  Musculoskeletal:        General: No swelling.     Cervical back: Neck supple.  Skin:    General: Skin is warm and dry.     Capillary Refill: Capillary refill takes less than 2 seconds.     Coloration: Skin is not cyanotic, jaundiced or pale.  Neurological:     General: No focal deficit present.     Mental Status: She is alert and oriented to person, place, and time.     Cranial Nerves: No cranial nerve deficit.     Motor: No weakness.  Psychiatric:        Mood and Affect: Mood normal.        Behavior: Behavior normal.     ED Results / Procedures / Treatments   Labs (all labs ordered are listed, but only abnormal results are displayed) Labs Reviewed  BASIC METABOLIC PANEL - Abnormal; Notable for the following components:      Result Value   Sodium 132 (*)    CO2 8 (*)    Glucose, Bld 432 (*)    Creatinine, Ser 1.22 (*)    Anion gap 22 (*)    All other components within normal limits  CBC - Abnormal; Notable for the following components:   Hemoglobin 11.6 (*)    All other components within normal limits  URINALYSIS, ROUTINE W REFLEX MICROSCOPIC - Abnormal; Notable for the following components:   Color, Urine STRAW (*)    Glucose, UA >=500 (*)    Hgb urine dipstick SMALL (*)    Ketones, ur 80 (*)    Protein, ur 30 (*)    All other components within normal limits  LIPASE, BLOOD - Abnormal; Notable for the following components:   Lipase 791 (*)    All other components within normal limits  CBG MONITORING, ED - Abnormal; Notable for the following components:   Glucose-Capillary 416 (*)    All other components within normal limits  I-STAT VENOUS BLOOD GAS, ED - Abnormal;  Notable for the following components:   pH, Ven 7.074 (*)    pCO2, Ven 19.4 (*)    pO2, Ven 69 (*)    Bicarbonate 5.7 (*)    TCO2 6 (*)    Acid-base deficit 23.0 (*)    All other components within normal limits  CBG MONITORING, ED - Abnormal; Notable for the following components:   Glucose-Capillary 525 (*)    All other components within  normal limits  CBG MONITORING, ED - Abnormal; Notable for the following components:   Glucose-Capillary 512 (*)    All other components within normal limits  BETA-HYDROXYBUTYRIC ACID  BASIC METABOLIC PANEL  BASIC METABOLIC PANEL  BASIC METABOLIC PANEL  BASIC METABOLIC PANEL  I-STAT BETA HCG BLOOD, ED ([facility], [facility], [facility] ONLY)    EKG None  Radiology No results found.  Procedures Procedures    Medications Ordered in ED Medications  insulin regular, human (MYXREDLIN) 100 units/ 100 mL infusion (has no administration in time range)  lactated ringers infusion (0 mLs Intravenous Hold 07/26/21 1602)  dextrose 5 % in lactated ringers infusion (0 mLs Intravenous Hold 07/26/21 1551)  dextrose 50 % solution 0-50 mL (has no administration in time range)  potassium chloride 10 mEq in 100 mL IVPB (has no administration in time range)  lactated ringers bolus 1,000 mL (1,000 mLs Intravenous New Bag/Given 07/26/21 1441)  HYDROmorphone (DILAUDID) injection 1 mg (1 mg Intravenous Given 07/26/21 1441)  lactated ringers bolus 1,600 mL (1,600 mLs Intravenous New Bag/Given 07/26/21 1555)    ED Course/ Medical Decision Making/ A&P                           Medical Decision Making Amount and/or Complexity of Data Reviewed Labs: ordered. Radiology: ordered.  Risk Prescription drug management.   This patient presents to the ED for concern of abdominal pain, nausea, and vomiting, this involves an extensive number of treatment options, and is a complaint that carries with it a high risk of complications and morbidity.  The differential diagnosis includes gastritis,  PUD, enteritis, pancreatitis, cholecystitis, hepatitis, DKA, dehydration   Co morbidities that complicate the patient evaluation  T1DM, eczema, asthma, bipolar disorder   Additional history obtained:  Additional history obtained from N/A External records from outside source obtained and reviewed including EMR   Lab Tests:  I Ordered, and personally interpreted labs.  The pertinent results include: Hyperglycemia with anion gap metabolic acidosis and ketonuria, consistent with DKA, elevated lipase consistent with pancreatitis, no leukocytosis, no evidence of UTI   Imaging Studies ordered:  I ordered imaging studies including CT of abdomen and pelvis I independently visualized and interpreted imaging which showed (pending at time of signout) I agree with the radiologist interpretation   Cardiac Monitoring: / EKG:  The patient was maintained on a cardiac monitor.  I personally viewed and interpreted the cardiac monitored which showed an underlying rhythm of: Sinus rhythm  Problem List / ED Course / Critical interventions / Medication management  Patient is a 26 year old female with history of type 1 diabetes, presenting for epigastric pain, nausea, vomiting, and p.o. intolerance.  Patient also states that she has now been using her insulin pump over the past several days.  She did take a subcutaneous dose of short acting insulin this morning.  The last time she ate was last night and she has subsequent vomiting, which has been typical for her over the past several weeks.  On arrival, patient is tachycardic and tachypneic.  She is alert and oriented.  On CBG, patient's blood sugar is elevated in the range of 500s.  Given her hyperglycemia and p.o. intolerance, patient was given IV fluids for hydration.  Laboratory work-up is consistent with DKA as well as pancreatitis in the setting of epigastric pain radiating to her back.  Patient states that she does not drink alcohol.  Etiology of  pancreatitis is unclear at this time.  CT of abdomen pelvis was ordered to assess for possible gallstone pancreatitis.  Patient was given Dilaudid for analgesia and subsequently had relief of her abdominal pain.  She did not have nausea or vomiting while in the ED.  Remaining lab work confirmed DKA.  Patient was started on insulin gtt. and additional IV fluids were ordered.  Patient will require admission.  Care of patient was signed out to oncoming ED provider. I ordered medication including IV fluids for dehydration; Dilaudid for analgesia, insulin GTT for DKA Reevaluation of the patient after these medicines showed that the patient improved I have reviewed the patients home medicines and have made adjustments as needed   Social Determinants of Health:  Lives independently  CRITICAL CARE Performed by: Gloris Manchester   Total critical care time: 35 minutes  Critical care time was exclusive of separately billable procedures and treating other patients.  Critical care was necessary to treat or prevent imminent or life-threatening deterioration.  Critical care was time spent personally by me on the following activities: development of treatment plan with patient and/or surrogate as well as nursing, discussions with consultants, evaluation of patient's response to treatment, examination of patient, obtaining history from patient or surrogate, ordering and performing treatments and interventions, ordering and review of laboratory studies, ordering and review of radiographic studies, pulse oximetry and re-evaluation of patient's condition.         Final Clinical Impression(s) / ED Diagnoses Final diagnoses:  Diabetic ketoacidosis without coma associated with type 1 diabetes mellitus (HCC)  Idiopathic acute pancreatitis, unspecified complication status  AKI (acute kidney injury) [facility])    Rx / DC Orders ED Discharge Orders     None         Gloris Manchester, MD 07/26/21 1608

## 2021-07-27 DIAGNOSIS — F17210 Nicotine dependence, cigarettes, uncomplicated: Secondary | ICD-10-CM | POA: Diagnosis present

## 2021-07-27 DIAGNOSIS — Z825 Family history of asthma and other chronic lower respiratory diseases: Secondary | ICD-10-CM | POA: Diagnosis not present

## 2021-07-27 DIAGNOSIS — J45909 Unspecified asthma, uncomplicated: Secondary | ICD-10-CM | POA: Diagnosis present

## 2021-07-27 DIAGNOSIS — Z91148 Patient's other noncompliance with medication regimen for other reason: Secondary | ICD-10-CM | POA: Diagnosis not present

## 2021-07-27 DIAGNOSIS — M329 Systemic lupus erythematosus, unspecified: Secondary | ICD-10-CM | POA: Diagnosis present

## 2021-07-27 DIAGNOSIS — F819 Developmental disorder of scholastic skills, unspecified: Secondary | ICD-10-CM | POA: Diagnosis present

## 2021-07-27 DIAGNOSIS — E7800 Pure hypercholesterolemia, unspecified: Secondary | ICD-10-CM | POA: Diagnosis present

## 2021-07-27 DIAGNOSIS — Z794 Long term (current) use of insulin: Secondary | ICD-10-CM | POA: Diagnosis not present

## 2021-07-27 DIAGNOSIS — K219 Gastro-esophageal reflux disease without esophagitis: Secondary | ICD-10-CM | POA: Diagnosis present

## 2021-07-27 DIAGNOSIS — R112 Nausea with vomiting, unspecified: Secondary | ICD-10-CM

## 2021-07-27 DIAGNOSIS — R748 Abnormal levels of other serum enzymes: Secondary | ICD-10-CM

## 2021-07-27 DIAGNOSIS — F319 Bipolar disorder, unspecified: Secondary | ICD-10-CM | POA: Diagnosis present

## 2021-07-27 DIAGNOSIS — Z9641 Presence of insulin pump (external) (internal): Secondary | ICD-10-CM | POA: Diagnosis present

## 2021-07-27 DIAGNOSIS — K859 Acute pancreatitis without necrosis or infection, unspecified: Secondary | ICD-10-CM | POA: Diagnosis present

## 2021-07-27 DIAGNOSIS — E876 Hypokalemia: Secondary | ICD-10-CM | POA: Diagnosis present

## 2021-07-27 DIAGNOSIS — Z833 Family history of diabetes mellitus: Secondary | ICD-10-CM | POA: Diagnosis not present

## 2021-07-27 DIAGNOSIS — E669 Obesity, unspecified: Secondary | ICD-10-CM | POA: Diagnosis present

## 2021-07-27 DIAGNOSIS — E871 Hypo-osmolality and hyponatremia: Secondary | ICD-10-CM | POA: Diagnosis present

## 2021-07-27 DIAGNOSIS — K8500 Idiopathic acute pancreatitis without necrosis or infection: Secondary | ICD-10-CM | POA: Diagnosis not present

## 2021-07-27 DIAGNOSIS — N179 Acute kidney failure, unspecified: Secondary | ICD-10-CM

## 2021-07-27 DIAGNOSIS — E1042 Type 1 diabetes mellitus with diabetic polyneuropathy: Secondary | ICD-10-CM | POA: Diagnosis present

## 2021-07-27 DIAGNOSIS — E1110 Type 2 diabetes mellitus with ketoacidosis without coma: Secondary | ICD-10-CM | POA: Diagnosis present

## 2021-07-27 DIAGNOSIS — E041 Nontoxic single thyroid nodule: Secondary | ICD-10-CM | POA: Diagnosis present

## 2021-07-27 DIAGNOSIS — E1142 Type 2 diabetes mellitus with diabetic polyneuropathy: Secondary | ICD-10-CM | POA: Diagnosis not present

## 2021-07-27 DIAGNOSIS — Z79899 Other long term (current) drug therapy: Secondary | ICD-10-CM | POA: Diagnosis not present

## 2021-07-27 DIAGNOSIS — E1010 Type 1 diabetes mellitus with ketoacidosis without coma: Secondary | ICD-10-CM | POA: Diagnosis not present

## 2021-07-27 DIAGNOSIS — Z6830 Body mass index (BMI) 30.0-30.9, adult: Secondary | ICD-10-CM | POA: Diagnosis not present

## 2021-07-27 LAB — RENAL FUNCTION PANEL
Albumin: 3.4 g/dL — ABNORMAL LOW (ref 3.5–5.0)
Albumin: 3.2 g/dL — ABNORMAL LOW (ref 3.5–5.0)
Anion gap: 7 (ref 5–15)
Anion gap: 6 (ref 5–15)
BUN: 9 mg/dL (ref 6–20)
BUN: 9 mg/dL (ref 6–20)
CO2: 18 mmol/L — ABNORMAL LOW (ref 22–32)
CO2: 20 mmol/L — ABNORMAL LOW (ref 22–32)
Calcium: 9.5 mg/dL (ref 8.9–10.3)
Calcium: 9.3 mg/dL (ref 8.9–10.3)
Chloride: 110 mmol/L (ref 98–111)
Chloride: 110 mmol/L (ref 98–111)
Creatinine, Ser: 0.63 mg/dL (ref 0.44–1.00)
Creatinine, Ser: 0.58 mg/dL (ref 0.44–1.00)
GFR, Estimated: >60 mL/min (ref >60)
GFR, Estimated: >60 mL/min (ref >60)
Glucose, Bld: 164 mg/dL — ABNORMAL HIGH (ref 70–99)
Glucose, Bld: 131 mg/dL — ABNORMAL HIGH (ref 70–99)
Phosphorus: 1.4 mg/dL — ABNORMAL LOW (ref 2.5–4.6)
Phosphorus: 1.3 mg/dL — ABNORMAL LOW (ref 2.5–4.6)
Potassium: 3.3 mmol/L — ABNORMAL LOW (ref 3.5–5.1)
Potassium: 3 mmol/L — ABNORMAL LOW (ref 3.5–5.1)
Sodium: 135 mmol/L (ref 135–145)
Sodium: 136 mmol/L (ref 135–145)

## 2021-07-27 LAB — CBG MONITORING, ED
Glucose-Capillary: 130 mg/dL — ABNORMAL HIGH (ref 70–99)
Glucose-Capillary: 149 mg/dL — ABNORMAL HIGH (ref 70–99)
Glucose-Capillary: 168 mg/dL — ABNORMAL HIGH (ref 70–99)
Glucose-Capillary: 171 mg/dL — ABNORMAL HIGH (ref 70–99)
Glucose-Capillary: 171 mg/dL — ABNORMAL HIGH (ref 70–99)
Glucose-Capillary: 177 mg/dL — ABNORMAL HIGH (ref 70–99)
Glucose-Capillary: 178 mg/dL — ABNORMAL HIGH (ref 70–99)
Glucose-Capillary: 181 mg/dL — ABNORMAL HIGH (ref 70–99)
Glucose-Capillary: 209 mg/dL — ABNORMAL HIGH (ref 70–99)
Glucose-Capillary: 237 mg/dL — ABNORMAL HIGH (ref 70–99)
Glucose-Capillary: 260 mg/dL — ABNORMAL HIGH (ref 70–99)

## 2021-07-27 LAB — BASIC METABOLIC PANEL
Anion gap: 13 (ref 5–15)
BUN: 11 mg/dL (ref 6–20)
CO2: 15 mmol/L — ABNORMAL LOW (ref 22–32)
Calcium: 9.8 mg/dL (ref 8.9–10.3)
Chloride: 109 mmol/L (ref 98–111)
Creatinine, Ser: 0.99 mg/dL (ref 0.44–1.00)
GFR, Estimated: >60 mL/min (ref >60)
Glucose, Bld: 228 mg/dL — ABNORMAL HIGH (ref 70–99)
Potassium: 3.8 mmol/L (ref 3.5–5.1)
Sodium: 137 mmol/L (ref 135–145)

## 2021-07-27 LAB — CBC
HCT: 32.7 % — ABNORMAL LOW (ref 36.0–46.0)
Hemoglobin: 10.2 g/dL — ABNORMAL LOW (ref 12.0–15.0)
MCH: 25.1 pg — ABNORMAL LOW (ref 26.0–34.0)
MCHC: 31.2 g/dL (ref 30.0–36.0)
MCV: 80.5 fL (ref 80.0–100.0)
Platelets: 257 10*3/uL (ref 150–400)
RBC: 4.06 MIL/uL (ref 3.87–5.11)
RDW: 15.8 % — ABNORMAL HIGH (ref 11.5–15.5)
WBC: 8.9 10*3/uL (ref 4.0–10.5)
nRBC: 0 % (ref 0.0–0.2)

## 2021-07-27 LAB — T4, FREE: Free T4: 0.75 ng/dL (ref 0.61–1.12)

## 2021-07-27 LAB — GLUCOSE, CAPILLARY
Glucose-Capillary: 268 mg/dL — ABNORMAL HIGH (ref 70–99)
Glucose-Capillary: 371 mg/dL — ABNORMAL HIGH (ref 70–99)
Glucose-Capillary: 255 mg/dL — ABNORMAL HIGH (ref 70–99)

## 2021-07-27 LAB — MAGNESIUM: Magnesium: 2.1 mg/dL (ref 1.7–2.4)

## 2021-07-27 LAB — LIPASE, BLOOD: Lipase: 688 U/L — ABNORMAL HIGH (ref 11–51)

## 2021-07-27 LAB — LIPID PANEL
Cholesterol: 202 mg/dL — ABNORMAL HIGH (ref 0–200)
HDL: 69 mg/dL (ref >40)
LDL Cholesterol: 115 mg/dL — ABNORMAL HIGH (ref 0–99)
Total CHOL/HDL Ratio: 2.9 RATIO
Triglycerides: 88 mg/dL (ref <150)
VLDL: 18 mg/dL (ref 0–40)

## 2021-07-27 LAB — BETA-HYDROXYBUTYRIC ACID: Beta-Hydroxybutyric Acid: 0.3 mmol/L — ABNORMAL HIGH (ref 0.05–0.27)

## 2021-07-27 LAB — TSH: TSH: 1.259 u[IU]/mL (ref 0.350–4.500)

## 2021-07-27 LAB — HIV ANTIBODY (ROUTINE TESTING W REFLEX): HIV Screen 4th Generation wRfx: REACTIVE — AB

## 2021-07-27 MED ORDER — INSULIN GLARGINE-YFGN 100 UNIT/ML ~~LOC~~ SOLN
10.0000 [IU] | Freq: Two times a day (BID) | SUBCUTANEOUS | Status: DC
  Administered 2021-07-27 – 2021-07-28 (×2): 10 [IU] via SUBCUTANEOUS
  Filled 2021-07-27 (×3): qty 0.1

## 2021-07-27 MED ORDER — POTASSIUM CHLORIDE 2 MEQ/ML IV SOLN
INTRAVENOUS | Status: DC
  Filled 2021-07-27 (×6): qty 1000

## 2021-07-27 MED ORDER — INSULIN GLARGINE-YFGN 100 UNIT/ML ~~LOC~~ SOLN
10.0000 [IU] | SUBCUTANEOUS | Status: DC
  Administered 2021-07-27: 10 [IU] via SUBCUTANEOUS
  Filled 2021-07-27: qty 0.1

## 2021-07-27 MED ORDER — INSULIN ASPART 100 UNIT/ML IJ SOLN
5.0000 [IU] | Freq: Three times a day (TID) | INTRAMUSCULAR | Status: DC
  Administered 2021-07-27 – 2021-07-28 (×3): 5 [IU] via SUBCUTANEOUS

## 2021-07-27 MED ORDER — POTASSIUM CHLORIDE CRYS ER 20 MEQ PO TBCR
40.0000 meq | EXTENDED_RELEASE_TABLET | ORAL | Status: AC
  Administered 2021-07-27 (×2): 40 meq via ORAL
  Filled 2021-07-27 (×2): qty 2

## 2021-07-27 MED ORDER — INSULIN ASPART 100 UNIT/ML IJ SOLN
0.0000 [IU] | Freq: Three times a day (TID) | INTRAMUSCULAR | Status: DC
  Administered 2021-07-27: 15 [IU] via SUBCUTANEOUS
  Administered 2021-07-27: 2 [IU] via SUBCUTANEOUS
  Administered 2021-07-28: 11 [IU] via SUBCUTANEOUS
  Administered 2021-07-28: 13 [IU] via SUBCUTANEOUS
  Administered 2021-07-28 – 2021-07-29 (×2): 5 [IU] via SUBCUTANEOUS
  Administered 2021-07-29: 8 [IU] via SUBCUTANEOUS

## 2021-07-27 MED ORDER — INSULIN ASPART 100 UNIT/ML IJ SOLN
0.0000 [IU] | Freq: Every day | INTRAMUSCULAR | Status: DC
  Administered 2021-07-27: 3 [IU] via SUBCUTANEOUS

## 2021-07-27 MED ORDER — DEXTROSE 5 % IV SOLN
30.0000 mmol | Freq: Once | INTRAVENOUS | Status: AC
Start: 2021-07-27 — End: 2021-07-28
  Administered 2021-07-27: 30 mmol via INTRAVENOUS
  Filled 2021-07-27: qty 10

## 2021-07-27 MED ORDER — INSULIN ASPART 100 UNIT/ML IJ SOLN
3.0000 [IU] | Freq: Three times a day (TID) | INTRAMUSCULAR | Status: DC
  Administered 2021-07-27: 3 [IU] via SUBCUTANEOUS

## 2021-07-27 NOTE — Progress Notes (Signed)
CSW spoke with patient who stated she has no friends or family to watch her daughter. Patient stated her friends and family ventured away from her because her daughter is autistic and nonverbal. Patients daughter is [name] 03/01/18. Patient had originally told CSW that her daughter was born in 2022. CSW asked patient if the babies father is involved and she said he was never involved. Patient was not able to give CSW names or numbers of anyone who can help her. CSW told patient if she did not have anyone to care for her daughter while she is in the hospital then she would have to contact CPS. Patient stated that was fine and she understands.     CPS report was made. Its unknown if the report will be accepted    CSW attempted to go back to patients roomed but learned patient was moved upstairs to the medical floor. CSW spoke with patient again who stated her sister [name] is going to come and pick up her daughter CSW asked patient why she didn't mention her sister before. Patient stated that she just kept calling people to find someone to care for her child. CSW asked for her sisters number to confirm she was coming. Patient gave CSW [phone]. CSW called the number multiple times and it went to VM. CSW asked patient to call her sister from her phone. Patients sister answered and stated she will come and pick-up patients daughter. [name] stated she is waiting on her husband and then she will come to the hospital. [name] was not able to give CSW a specific time. [name] also told CSW that her number is [phone]. CSW asked for patients sister to contact CSW when she is on her way or knows a specific time. [name] stated she would do that.    CSW attempted to contact the CPS intake worker who took the report with no response.

## 2021-07-27 NOTE — ED Notes (Signed)
Order placed for IV team to place second IV.  Multiple attempts made without success.  Unable to obtain lab work.

## 2021-07-27 NOTE — ED Notes (Signed)
Patient has toddler daughter at bedside.  Reports she is unable to find anyone to help take care of daughter while she is in the hospital.  Daughter is autistic and family/friends will not assist patient with child care.  Charge RN made aware

## 2021-07-27 NOTE — Inpatient Diabetes Management (Addendum)
Inpatient Diabetes Program Recommendations  AACE/ADA: New Consensus Statement on Inpatient Glycemic Control (2015)  Target Ranges:  Prepandial:   less than 140 mg/dL      Peak postprandial:   less than 180 mg/dL (1-2 hours)      Critically ill patients:  140 - 180 mg/dL   Lab Results  Component Value Date   GLUCAP 177 (H) 07/27/2021   HGBA1C 14.9 (H) 07/26/2021    Review of Glycemic Control  Diabetes history: type 2 Outpatient Diabetes medications: T slim insulin pump Current orders for Inpatient glycemic control: IV insulin  Inpatient Diabetes Program Recommendations:   Spoke with patient at the bedside. Patient's pump was in her purse, but the rechargeable battery had died. Did not have any other pieces for recharging pump or insertion kit. Patient states that she had left the pump at home when she went to a cookout at her mother's and did not get home until 2 am. She has had this pump for about 2 years. She will be seeing a new endocrinologist with [facility] on 6/27. Noted that toddler child, autistic and nonverbal, is in the room with her since she has no one to take care of her.   Recommend starting Semglee 20 units daily to be given 2 hours prior to transition off the IV insulin drip. Patient agrees with this dosage. She could not tell me her basal rates and CHO coverage without looking at her pump. She states that her blood sugars have been running in the 200's.   Recommending transition SQ insulin orders: Semglee 20 units daily, Novolog SENSITIVE correction scale TID & 0-5 units HS scale, Novolog 3 units TID with meals if eating at least 50% of meal.   Harvel Ricks RN BSN CDE Diabetes Coordinator Pager: [phone]  8am-5pm

## 2021-07-27 NOTE — Progress Notes (Signed)
CPS report screened in and assigned to [place]. Supervisor [place]. Intake did not have any work cell phone numbers. Intake stated they would send an email about the update about the sister. Report screened in as an immediate.

## 2021-07-27 NOTE — ED Notes (Signed)
Multiple attempts have been made to start second IV and get ordered lab work.  Unable to obtain at this time

## 2021-07-27 NOTE — Progress Notes (Signed)
Pt arrived to unit at this w/ 26 year old daughter on stretcher from ED. Haskel Khan, RN at bedside to attend to child's needs and states she will not leave the child until its resolved. Charge RN, Water quality scientist, and [facility] aware of situation.

## 2021-07-27 NOTE — Progress Notes (Signed)
The patient's sister has arrived to take the patients baby.  Report given to primary nurse and director that family at bedside.  Social worker was in room talking to the family member.

## 2021-07-27 NOTE — Progress Notes (Addendum)
PROGRESS NOTE  [name] ZES:[id number] DOB: 1995/08/16   PCP: Default, Provider, MD  Patient is from: Home.  DOA: 07/26/2021 LOS: 0  Chief complaints Chief Complaint  Patient presents with   Abdominal Pain   Nausea   Emesis   Hyperglycemia     Brief Narrative / Interim history: 26 year old F with PMH of DM-1 with neuropathy, GERD and bipolar disorder presenting with nausea, vomiting, abdominal pain due to not using her insulin.  Reportedly, she forgot to take her insulin pump with her when she went to her mother's house.  She also tells me that her insulin pump is not working.   On arrival, vitals were stable.  pH 7.07.  Glucose 432.  CO2 8.  AG 22.  UA with 80 ketones.  Lipase 791. Cr 1.22.  CT abdomen and pelvis ordered.  She was started on insulin drip and IV fluid.  CT abdomen and pelvis concerning for acute uncomplicated pancreatitis.  The next day, DKA and GI symptoms resolved.  She was transitioned to subcu insulin and started on clear liquid diet.  Subjective: Seen and examined earlier this morning.  No major events overnight of this morning.  Feels well this morning.  No complaints.  She denies chest pain, dyspnea, nausea, vomiting, abdominal pain, UTI or respiratory symptoms.  Tells me that her insulin pump is not working.  She says she had some sore throat for 2 days prior to presentation that has gone.  Sexually active with 1 female partner.  Denies oral sex.  Objective: Vitals:   07/27/21 0500 07/27/21 0600 07/27/21 0700 07/27/21 0849  BP: 125/81 128/77 106/63 120/73  Pulse: (!) 104 (!) 105 (!) 102 (!) 101  Resp: 16 (!) 22 18 17   Temp:      TempSrc:      SpO2: 99% 99% 100% 100%  Weight:        Examination:  GENERAL: No apparent distress.  Nontoxic. HEENT: MMM.  Vision and hearing grossly intact.  NECK: Supple.  No apparent JVD.  RESP:  No IWOB.  Fair aeration bilaterally. CVS:  RRR. Heart sounds normal.  ABD/GI/GU: BS+. Abd soft, NTND.  MSK/EXT:  Moves  extremities. No apparent deformity. No edema.  SKIN: no apparent skin lesion or wound NEURO: Awake, alert and oriented appropriately.  No apparent focal neuro deficit. PSYCH: Calm. Normal affect.   Procedures:  None  Microbiology summarized: None  Assessment and plan: Principal Problem:   Diabetic ketoacidosis in type 1 diabetic  Active Problems:   Acute pancreatitis without infection or necrosis   Elevated lipase   AKI (acute kidney injury) (HCC)   Nausea and vomiting   Goiter/hx of thyroiditis   Diabetic peripheral neuropathy (HCC)   GERD (gastroesophageal reflux disease)   Hyperlipidemia   Thyroid nodule   Hypokalemia  Uncontrolled type 1 diabetes with diabetic ketoacidosis, hyperglycemia and neuropathy: A1c 14.9%.  Suspect noncompliance.  Reportedly, she forgot to take her insulin pump when she went to visit her mother.  She also tells me that her insulin pump was not working.  DKA resolved.  BHB 0.3.  Anion gap 7. Recent Labs  Lab 07/27/21 0747 07/27/21 0850 07/27/21 0952 07/27/21 1159 07/27/21 1308  GLUCAP 149* 177* 171* 130* 209*  -Transition to subcu insulin-Semglee 10 units, SSI-moderate and NovoLog 5 units 3 times daily -Clear liquid diet in the setting of acute pancreatitis -Continue IV LR at 125 cc an hour -Monitor CBG and adjust insulin as appropriate. -Reglan ACHS -Appreciate help by  diabetic coronary  Acute uncomplicated pancreatitis: Lipase elevated to 700.  CT abdomen concerning for acute pancreatitis.  Unclear etiology of this.  She denies drinking alcohol.  LFT and triglyceride within normal..  -Check RUQ ultrasound -Check lipid panel in the morning  AKI: Likely due to the above.  Resolved. Recent Labs    07/26/21 0957 07/26/21 1504 07/27/21 0123 07/27/21 0833 07/27/21 1124  BUN 12 15 11 9 9   CREATININE 1.22* 1.35* 0.99 0.63 0.58  -Monitor  GERD -Continue IV PPI  Thyroid nodule: TSH and free T4 within normal.  Thyroid with single  sub-1 cm right thyroid nodule which does not meet TI-RADS criteria for biopsy nor follow-up.  Hypokalemia -P.o. KCl 40x2  History of bipolar disorder: Stable.  Does not seem to be on medication.  Positive HIV screen: Sexually active with 1 female partner. -Follow-up quantitative RNA and CD4 cell counts. -ID aware.  Obesity Body mass index is 30.27 kg/m. -Encourage lifestyle change to lose weight.   DVT prophylaxis:  SCDs Start: 07/26/21 1645  Code Status: Full code Family Communication: None at bedside Level of care: Med-Surg Status is: Inpatient Remains inpatient appropriate because: Acute pancreatitis and diabetic ketoacidosis.   Final disposition: Likely home once medically stable. Consultants:  Infectious disease  Sch Meds:  Scheduled Meds:  insulin aspart  0-15 Units Subcutaneous TID WC   insulin aspart  0-5 Units Subcutaneous QHS   insulin aspart  5 Units Subcutaneous TID WC   insulin glargine-yfgn  10 Units Subcutaneous Q24H   pantoprazole (PROTONIX) IV  40 mg Intravenous Q24H   potassium chloride  40 mEq Oral Q4H   Continuous Infusions:  lactated ringers 1,000 mL with potassium chloride 20 mEq infusion 125 mL/hr at 07/27/21 1204   PRN Meds:.acetaminophen **OR** acetaminophen, albuterol, dextrose, metoCLOPramide (REGLAN) injection, morphine injection  Antimicrobials: Anti-infectives (From admission, onward)    None        I have personally reviewed the following labs and images: CBC: Recent Labs  Lab 07/26/21 0957 07/26/21 1458 07/27/21 1124  WBC 9.8  --  8.9  HGB 11.6* 14.3 10.2*  HCT 37.1 42.0 32.7*  MCV 83.0  --  80.5  PLT 298  --  257   BMP &GFR Recent Labs  Lab 07/26/21 0957 07/26/21 1458 07/26/21 1504 07/27/21 0123 07/27/21 0823 07/27/21 0833 07/27/21 1124  NA 132* 135 134* 137  --  135 136  K 4.2 4.9 5.3* 3.8  --  3.3* 3.0*  CL 102  --  101 109  --  110 110  CO2 8*  --  <7* 15*  --  18* 20*  GLUCOSE 432*  --  588* 228*  --   164* 131*  BUN 12  --  15 11  --  9 9  CREATININE 1.22*  --  1.35* 0.99  --  0.63 0.58  CALCIUM 9.7  --  9.5 9.8  --  9.5 9.3  MG  --   --   --   --  2.1  --   --   PHOS  --   --   --   --   --  1.4* 1.3*   CrCl cannot be calculated (Unknown ideal weight.). Liver & Pancreas: Recent Labs  Lab 07/26/21 1504 07/27/21 0833 07/27/21 1124  AST 13*  --   --   ALT 13  --   --   ALKPHOS 81  --   --   BILITOT 1.2  --   --  PROT 8.6*  --   --   ALBUMIN 3.9 3.4* 3.2*   Recent Labs  Lab 07/26/21 0957 07/27/21 0123  LIPASE 791* 688*   No results for input(s): "AMMONIA" in the last 168 hours. Diabetic: Recent Labs    07/26/21 1631  HGBA1C 14.9*   Recent Labs  Lab 07/27/21 0747 07/27/21 0850 07/27/21 0952 07/27/21 1159 07/27/21 1308  GLUCAP 149* 177* 171* 130* 209*   Cardiac Enzymes: No results for input(s): "CKTOTAL", "CKMB", "CKMBINDEX", "TROPONINI" in the last 168 hours. No results for input(s): "PROBNP" in the last 8760 hours. Coagulation Profile: No results for input(s): "INR", "PROTIME" in the last 168 hours. Thyroid Function Tests: Recent Labs    07/27/21 0123  TSH 1.259  FREET4 0.75   Lipid Profile: Recent Labs    07/27/21 0123  CHOL 202*  HDL 69  LDLCALC 115*  TRIG 88  CHOLHDL 2.9   Anemia Panel: No results for input(s): "VITAMINB12", "FOLATE", "FERRITIN", "TIBC", "IRON", "RETICCTPCT" in the last 72 hours. Urine analysis:    Component Value Date/Time   COLORURINE STRAW (A) 07/26/2021 0957   APPEARANCEUR CLEAR 07/26/2021 0957   LABSPEC 1.028 07/26/2021 0957   PHURINE 5.0 07/26/2021 0957   GLUCOSEU >=500 (A) 07/26/2021 0957   HGBUR SMALL (A) 07/26/2021 0957   BILIRUBINUR NEGATIVE 07/26/2021 0957   KETONESUR 80 (A) 07/26/2021 0957   PROTEINUR 30 (A) 07/26/2021 0957   UROBILINOGEN 0.2 07/28/2013 1729   NITRITE NEGATIVE 07/26/2021 0957   LEUKOCYTESUR NEGATIVE 07/26/2021 0957   Sepsis Labs: Invalid input(s): "PROCALCITONIN",  "LACTICIDVEN"  Microbiology: No results found for this or any previous visit (from the past 240 hour(s)).  Radiology Studies: CT ABDOMEN PELVIS WO CONTRAST  Result Date: 07/26/2021 CLINICAL DATA:  Pancreatitis, nausea and vomiting onset this morning, abdominal pain EXAM: CT ABDOMEN AND PELVIS WITHOUT CONTRAST TECHNIQUE: Multidetector CT imaging of the abdomen and pelvis was performed following the standard protocol without IV contrast. RADIATION DOSE REDUCTION: This exam was performed according to the departmental dose-optimization program which includes automated exposure control, adjustment of the mA and/or kV according to patient size and/or use of iterative reconstruction technique. COMPARISON:  None Available. FINDINGS: Lower chest: No acute abnormality. Hepatobiliary: No solid liver abnormality is seen. No gallstones, gallbladder wall thickening, or biliary dilatation. Pancreas: Examination of the upper abdomen is generally quite limited by breath motion artifact and lack of intravenous contrast. Within this limitation, there appears to be peripancreatic edema and a small volume of fluid (series 3, image 28). No obvious acute pancreatic fluid collection. Spleen: Normal in size without significant abnormality. Adrenals/Urinary Tract: Adrenal glands are unremarkable. Kidneys are normal, without renal calculi, solid lesion, or hydronephrosis. Bladder is unremarkable. Stomach/Bowel: Stomach is within normal limits. Normal appendix. No evidence of bowel wall thickening, distention, or inflammatory changes. Moderate burden of stool and stool balls throughout the colon and rectum. Vascular/Lymphatic: No significant vascular findings are present. No enlarged abdominal or pelvic lymph nodes. Reproductive: No mass or other significant abnormality. Other: No abdominal wall hernia or abnormality. No ascites. Musculoskeletal: No acute or significant osseous findings. IMPRESSION: Examination of the upper abdomen is  generally quite limited by breath motion artifact and lack of intravenous contrast. Within this limitation, there appears to be peripancreatic edema and a small volume of fluid. No obvious acute pancreatic fluid collection. Findings are consistent with acute pancreatitis. Contrast enhanced examination with better breath motion control may be helpful to assess further if complication is suspected. Electronically Signed   By: Trinna Post  Jane Canary Bibbey M.D.   On: 07/26/2021 19:14   US THYROID  Result Date: 07/26/2021 CLINICAL DATA:  Goiter. EXAM: THYROID ULTRASOUND TECHNIQUE: Ultrasound examination of the thyroid gland and adjacent soft tissues was performed. COMPARISON:  US Thyroid, 09/21/2016. FINDINGS: Parenchymal Echotexture: Normal Isthmus: 0.3 cm Right lobe: 4.8 x 1.6 x 1.4 cm Left lobe: 4.2 x 1.0 x 1.4 cm _________________________________________________________ Estimated total number of nodules >/= 1 cm: 0 Number of spongiform nodules >/=  2 cm not described below (TR1): 0 Number of mixed cystic and solid nodules >/= 1.5 cm not described below (TR2): 0 _________________________________________________________ Sub-1 cm RIGHT mid TR 4 thyroid nodule with maximum dimension of 0.6 cm. Given size (<0.9 cm) and appearance, this nodule does NOT meet TI-RADS criteria for biopsy or dedicated follow-up. No cervical adenopathy or abnormal fluid collection within the imaged neck. IMPRESSION: 1. Normal nonenlarged thyroid. 2. Single sub-1 cm RIGHT thyroid nodule which does not meet TI-RADS criteria for biopsy nor follow-up. The above is in keeping with the ACR TI-RADS recommendations - J Am Coll Radiol 2017;14:587-595. Electronically Signed   By: Roanna BanningJon  Mugweru M.D.   On: 07/26/2021 18:19   DG CHEST PORT 1 VIEW  Result Date: 07/26/2021 CLINICAL DATA:  Abdominal pain and vomiting. EXAM: PORTABLE CHEST 1 VIEW COMPARISON:  March 25, 2017 FINDINGS: The heart size and mediastinal contours are within normal limits. Both lungs are  clear. The visualized skeletal structures are unremarkable. IMPRESSION: No active disease. Electronically Signed   By: Aram Candelahaddeus  Houston M.D.   On: 07/26/2021 16:34      Herschell Virani T. Rylin Seavey [facility]  If 7PM-7AM, please contact night-coverage [email or web address] 07/27/2021, 1:33 PM

## 2021-07-28 DIAGNOSIS — E1142 Type 2 diabetes mellitus with diabetic polyneuropathy: Secondary | ICD-10-CM | POA: Diagnosis not present

## 2021-07-28 DIAGNOSIS — E1010 Type 1 diabetes mellitus with ketoacidosis without coma: Secondary | ICD-10-CM | POA: Diagnosis not present

## 2021-07-28 DIAGNOSIS — E871 Hypo-osmolality and hyponatremia: Secondary | ICD-10-CM

## 2021-07-28 DIAGNOSIS — K8500 Idiopathic acute pancreatitis without necrosis or infection: Secondary | ICD-10-CM | POA: Diagnosis not present

## 2021-07-28 DIAGNOSIS — N179 Acute kidney failure, unspecified: Secondary | ICD-10-CM | POA: Diagnosis not present

## 2021-07-28 LAB — COMPREHENSIVE METABOLIC PANEL
ALT: 13 U/L (ref 0–44)
AST: 15 U/L (ref 15–41)
Albumin: 3.5 g/dL (ref 3.5–5.0)
Alkaline Phosphatase: 63 U/L (ref 38–126)
Anion gap: 8 (ref 5–15)
BUN: 8 mg/dL (ref 6–20)
CO2: 18 mmol/L — ABNORMAL LOW (ref 22–32)
Calcium: 9.3 mg/dL (ref 8.9–10.3)
Chloride: 107 mmol/L (ref 98–111)
Creatinine, Ser: 0.72 mg/dL (ref 0.44–1.00)
GFR, Estimated: >60 mL/min (ref >60)
Glucose, Bld: 249 mg/dL — ABNORMAL HIGH (ref 70–99)
Potassium: 4.4 mmol/L (ref 3.5–5.1)
Sodium: 133 mmol/L — ABNORMAL LOW (ref 135–145)
Total Bilirubin: 1.1 mg/dL (ref 0.3–1.2)
Total Protein: 7.7 g/dL (ref 6.5–8.1)

## 2021-07-28 LAB — MAGNESIUM: Magnesium: 1.8 mg/dL (ref 1.7–2.4)

## 2021-07-28 LAB — CBC
HCT: 34.9 % — ABNORMAL LOW (ref 36.0–46.0)
Hemoglobin: 10.9 g/dL — ABNORMAL LOW (ref 12.0–15.0)
MCH: 25.4 pg — ABNORMAL LOW (ref 26.0–34.0)
MCHC: 31.2 g/dL (ref 30.0–36.0)
MCV: 81.4 fL (ref 80.0–100.0)
Platelets: 274 10*3/uL (ref 150–400)
RBC: 4.29 MIL/uL (ref 3.87–5.11)
RDW: 16.1 % — ABNORMAL HIGH (ref 11.5–15.5)
WBC: 9.3 10*3/uL (ref 4.0–10.5)
nRBC: 0 % (ref 0.0–0.2)

## 2021-07-28 LAB — HIV-1 RNA QUANT-NO REFLEX-BLD
HIV 1 RNA Quant: <20 copies/mL
LOG10 HIV-1 RNA: UNDETERMINED log10copy/mL

## 2021-07-28 LAB — GLUCOSE, CAPILLARY
Glucose-Capillary: 315 mg/dL — ABNORMAL HIGH (ref 70–99)
Glucose-Capillary: 255 mg/dL — ABNORMAL HIGH (ref 70–99)
Glucose-Capillary: 230 mg/dL — ABNORMAL HIGH (ref 70–99)
Glucose-Capillary: 108 mg/dL — ABNORMAL HIGH (ref 70–99)

## 2021-07-28 LAB — PHOSPHORUS: Phosphorus: 1.8 mg/dL — ABNORMAL LOW (ref 2.5–4.6)

## 2021-07-28 LAB — LIPASE, BLOOD: Lipase: 262 U/L — ABNORMAL HIGH (ref 11–51)

## 2021-07-28 MED ORDER — ORAL CARE MOUTH RINSE
15.0000 mL | Freq: Two times a day (BID) | OROMUCOSAL | Status: DC
  Administered 2021-07-28 – 2021-07-29 (×3): 15 mL via OROMUCOSAL

## 2021-07-28 MED ORDER — INSULIN GLARGINE-YFGN 100 UNIT/ML ~~LOC~~ SOLN
20.0000 [IU] | Freq: Two times a day (BID) | SUBCUTANEOUS | Status: DC
  Administered 2021-07-28 – 2021-07-29 (×2): 20 [IU] via SUBCUTANEOUS
  Filled 2021-07-28 (×3): qty 0.2

## 2021-07-28 MED ORDER — INSULIN ASPART 100 UNIT/ML IJ SOLN
6.0000 [IU] | Freq: Three times a day (TID) | INTRAMUSCULAR | Status: DC
  Administered 2021-07-28 – 2021-07-29 (×3): 6 [IU] via SUBCUTANEOUS

## 2021-07-28 MED ORDER — CHLORHEXIDINE GLUCONATE 0.12 % MT SOLN
15.0000 mL | Freq: Two times a day (BID) | OROMUCOSAL | Status: DC
  Administered 2021-07-28 – 2021-07-29 (×3): 15 mL via OROMUCOSAL
  Filled 2021-07-28 (×2): qty 15

## 2021-07-28 NOTE — Progress Notes (Signed)
PROGRESS NOTE  [name] BJY:[id number] DOB: Jul 18, 1995   PCP: Default, Provider, MD  Patient is from: Home.  DOA: 07/26/2021 LOS: 1  Chief complaints Chief Complaint  Patient presents with   Abdominal Pain   Nausea   Emesis   Hyperglycemia     Brief Narrative / Interim history: 26 year old F with PMH of DM-1 with neuropathy, GERD and bipolar disorder presenting with nausea, vomiting, abdominal pain due to not using her insulin.  Reportedly, she forgot to take her insulin pump with her when she went to her mother's house.  She also tells me that her insulin pump is not working.   On arrival, vitals were stable.  pH 7.07.  Glucose 432.  CO2 8.  AG 22.  UA with 80 ketones.  Lipase 791. Cr 1.22.  CT abdomen and pelvis concerning for acute uncomplicated pancreatitis. She was started on insulin drip and IV fluid.  The next day, DKA and GI symptoms resolved.  She was transitioned to subcu insulin and started on clear liquid diet.  Slowly improving.  Subjective: Seen and examined earlier this morning.  No major events overnight of this morning.  She reports abdominal discomfort and pain after breakfast.  She is on clear liquid diet.  She rates her pain 7/10 although she does not appear to be in that much distress.  Pain is around the bellybutton.  No radiation.  She denies nausea or vomiting.  Objective: Vitals:   07/28/21 0007 07/28/21 0500 07/28/21 0909 07/28/21 1219  BP: 97/66 104/72 111/75 114/77  Pulse: 85  84 96  Resp: 17 15  15   Temp: 98.9 F (37.2 C) 98.9 F (37.2 C) 98.9 F (37.2 C) 98.7 F (37.1 C)  TempSrc: Oral Oral Oral Oral  SpO2: 100% 100% 100%   Weight:      Height:  5\' 4"  (1.626 m)      Examination:  GENERAL: No apparent distress.  Nontoxic. HEENT: MMM.  Vision and hearing grossly intact.  NECK: Supple.  No apparent JVD.  RESP:  No IWOB.  Fair aeration bilaterally. CVS:  RRR. Heart sounds normal.  ABD/GI/GU: BS+. Abd soft.  Mild diffuse  tenderness. MSK/EXT:  Moves extremities. No apparent deformity. No edema.  SKIN: no apparent skin lesion or wound NEURO: Awake and alert. Oriented appropriately.  No apparent focal neuro deficit. PSYCH: Calm. Normal affect.   Procedures:  None  Microbiology summarized: None  Assessment and plan: Principal Problem:   Diabetic ketoacidosis in type 1 diabetic  Active Problems:   Acute pancreatitis without infection or necrosis   Elevated lipase   AKI (acute kidney injury) (HCC)   Nausea and vomiting   Goiter/hx of thyroiditis   Diabetic peripheral neuropathy (HCC)   GERD (gastroesophageal reflux disease)   Hyperlipidemia   Thyroid nodule   Hypokalemia  Uncontrolled type 1 diabetes with diabetic ketoacidosis, hyperglycemia and neuropathy: A1c 14.9%.  Suspect noncompliance.  Reportedly, she forgot to take her insulin pump when she went to visit her mother.  She also tells me that her insulin pump was not working.  DKA resolved.  Recent Labs  Lab 07/27/21 1457 07/27/21 1758 07/27/21 2132 07/28/21 0907 07/28/21 1213  GLUCAP 268* 371* 255* 315* 255*  -Increased Semglee from 10 units twice daily to 20 units twice daily -Increase NovoLog from 5 units 3 times daily to 6 units 3 times daily -Continue SSI-midright -On full liquid diet in the setting of acute pancreatitis -Continue IV LR at 125 cc an hour -Monitor  CBG and adjust insulin as appropriate. -Reglan ACHS -Appreciate help by diabetic coronary  Acute uncomplicated pancreatitis: CT abdomen concerning for acute pancreatitis.  Unclear etiology of this.  She denies drinking alcohol.  LFT and triglyceride within normal.  Lipase downtrending. -Advance diet to full liquid  AKI: Likely due to the above.  Resolved. Recent Labs    07/26/21 0957 07/26/21 1504 07/27/21 0123 07/27/21 0833 07/27/21 1124 07/28/21 0154  BUN 12 15 11 9 9 8   CREATININE 1.22* 1.35* 0.99 0.63 0.58 0.72  -Monitor  GERD -Continue IV PPI  Thyroid  nodule: TSH and free T4 within normal.  Thyroid with single sub-1 cm right thyroid nodule which does not meet TI-RADS criteria for biopsy nor follow-up.  Hypokalemia/hypophosphatemia -Monitor replenish as appropriate.  Hyponatremia: Corrects to normal for hyperglycemia.  History of bipolar disorder: Stable.  Does not seem to be on medication.  Positive HIV screen: Sexually active with 1 female partner. -Follow-up quantitative RNA and CD4 cell counts. -ID aware.  Obesity Body mass index is 30.27 kg/m. -Encourage lifestyle change to lose weight.   DVT prophylaxis:  SCDs Start: 07/26/21 1645  Code Status: Full code Family Communication: None at bedside Level of care: Med-Surg Status is: Inpatient Remains inpatient appropriate because: Acute pancreatitis   Final disposition: Likely home once medically stable. Consultants:  Infectious disease  Sch Meds:  Scheduled Meds:  chlorhexidine  15 mL Mouth Rinse BID   insulin aspart  0-15 Units Subcutaneous TID WC   insulin aspart  0-5 Units Subcutaneous QHS   insulin aspart  6 Units Subcutaneous TID WC   insulin glargine-yfgn  20 Units Subcutaneous BID   mouth rinse  15 mL Mouth Rinse q12n4p   pantoprazole (PROTONIX) IV  40 mg Intravenous Q24H   Continuous Infusions:  lactated ringers 1,000 mL with potassium chloride 20 mEq infusion 125 mL/hr at 07/28/21 0609   PRN Meds:.acetaminophen **OR** acetaminophen, albuterol, dextrose, metoCLOPramide (REGLAN) injection, morphine injection  Antimicrobials: Anti-infectives (From admission, onward)    None        I have personally reviewed the following labs and images: CBC: Recent Labs  Lab 07/26/21 0957 07/26/21 1458 07/27/21 1124 07/28/21 0154  WBC 9.8  --  8.9 9.3  HGB 11.6* 14.3 10.2* 10.9*  HCT 37.1 42.0 32.7* 34.9*  MCV 83.0  --  80.5 81.4  PLT 298  --  257 274   BMP &GFR Recent Labs  Lab 07/26/21 1504 07/27/21 0123 07/27/21 0823 07/27/21 0833  07/27/21 1124 07/28/21 0154  NA 134* 137  --  135 136 133*  K 5.3* 3.8  --  3.3* 3.0* 4.4  CL 101 109  --  110 110 107  CO2 <7* 15*  --  18* 20* 18*  GLUCOSE 588* 228*  --  164* 131* 249*  BUN 15 11  --  9 9 8   CREATININE 1.35* 0.99  --  0.63 0.58 0.72  CALCIUM 9.5 9.8  --  9.5 9.3 9.3  MG  --   --  2.1  --   --  1.8  PHOS  --   --   --  1.4* 1.3* 1.8*   Estimated Creatinine Clearance: 107.1 mL/min (by C-G formula based on SCr of 0.72 mg/dL). Liver & Pancreas: Recent Labs  Lab 07/26/21 1504 07/27/21 0833 07/27/21 1124 07/28/21 0154  AST 13*  --   --  15  ALT 13  --   --  13  ALKPHOS 81  --   --  63  BILITOT 1.2  --   --  1.1  PROT 8.6*  --   --  7.7  ALBUMIN 3.9 3.4* 3.2* 3.5   Recent Labs  Lab 07/26/21 0957 07/27/21 0123 07/28/21 0154  LIPASE 791* 688* 262*   No results for input(s): "AMMONIA" in the last 168 hours. Diabetic: Recent Labs    07/26/21 1631  HGBA1C 14.9*   Recent Labs  Lab 07/27/21 1457 07/27/21 1758 07/27/21 2132 07/28/21 0907 07/28/21 1213  GLUCAP 268* 371* 255* 315* 255*   Cardiac Enzymes: No results for input(s): "CKTOTAL", "CKMB", "CKMBINDEX", "TROPONINI" in the last 168 hours. No results for input(s): "PROBNP" in the last 8760 hours. Coagulation Profile: No results for input(s): "INR", "PROTIME" in the last 168 hours. Thyroid Function Tests: Recent Labs    07/27/21 0123  TSH 1.259  FREET4 0.75   Lipid Profile: Recent Labs    07/27/21 0123  CHOL 202*  HDL 69  LDLCALC 115*  TRIG 88  CHOLHDL 2.9   Anemia Panel: No results for input(s): "VITAMINB12", "FOLATE", "FERRITIN", "TIBC", "IRON", "RETICCTPCT" in the last 72 hours. Urine analysis:    Component Value Date/Time   COLORURINE STRAW (A) 07/26/2021 0957   APPEARANCEUR CLEAR 07/26/2021 0957   LABSPEC 1.028 07/26/2021 0957   PHURINE 5.0 07/26/2021 0957   GLUCOSEU >=500 (A) 07/26/2021 0957   HGBUR SMALL (A) 07/26/2021 0957   BILIRUBINUR NEGATIVE 07/26/2021 0957    KETONESUR 80 (A) 07/26/2021 0957   PROTEINUR 30 (A) 07/26/2021 0957   UROBILINOGEN 0.2 07/28/2013 1729   NITRITE NEGATIVE 07/26/2021 0957   LEUKOCYTESUR NEGATIVE 07/26/2021 0957   Sepsis Labs: Invalid input(s): "PROCALCITONIN", "LACTICIDVEN"  Microbiology: No results found for this or any previous visit (from the past 240 hour(s)).  Radiology Studies: No results found.    Kayton Dunaj T. Saharra [facility]  If 7PM-7AM, please contact night-coverage [email or web address] 07/28/2021, 4:37 PM

## 2021-07-29 ENCOUNTER — Other Ambulatory Visit (HOSPITAL_COMMUNITY): Payer: Self-pay

## 2021-07-29 DIAGNOSIS — K8500 Idiopathic acute pancreatitis without necrosis or infection: Secondary | ICD-10-CM | POA: Diagnosis not present

## 2021-07-29 DIAGNOSIS — N179 Acute kidney failure, unspecified: Secondary | ICD-10-CM | POA: Diagnosis not present

## 2021-07-29 DIAGNOSIS — E1142 Type 2 diabetes mellitus with diabetic polyneuropathy: Secondary | ICD-10-CM | POA: Diagnosis not present

## 2021-07-29 DIAGNOSIS — E1010 Type 1 diabetes mellitus with ketoacidosis without coma: Secondary | ICD-10-CM | POA: Diagnosis not present

## 2021-07-29 DIAGNOSIS — E785 Hyperlipidemia, unspecified: Secondary | ICD-10-CM

## 2021-07-29 LAB — RENAL FUNCTION PANEL
Albumin: 3 g/dL — ABNORMAL LOW (ref 3.5–5.0)
Anion gap: 7 (ref 5–15)
BUN: 6 mg/dL (ref 6–20)
CO2: 23 mmol/L (ref 22–32)
Calcium: 9 mg/dL (ref 8.9–10.3)
Chloride: 104 mmol/L (ref 98–111)
Creatinine, Ser: 0.6 mg/dL (ref 0.44–1.00)
GFR, Estimated: >60 mL/min (ref >60)
Glucose, Bld: 178 mg/dL — ABNORMAL HIGH (ref 70–99)
Phosphorus: 2.4 mg/dL — ABNORMAL LOW (ref 2.5–4.6)
Potassium: 3.6 mmol/L (ref 3.5–5.1)
Sodium: 134 mmol/L — ABNORMAL LOW (ref 135–145)

## 2021-07-29 LAB — CBC
HCT: 31.3 % — ABNORMAL LOW (ref 36.0–46.0)
Hemoglobin: 10.1 g/dL — ABNORMAL LOW (ref 12.0–15.0)
MCH: 25.8 pg — ABNORMAL LOW (ref 26.0–34.0)
MCHC: 32.3 g/dL (ref 30.0–36.0)
MCV: 80.1 fL (ref 80.0–100.0)
Platelets: 270 10*3/uL (ref 150–400)
RBC: 3.91 MIL/uL (ref 3.87–5.11)
RDW: 16.1 % — ABNORMAL HIGH (ref 11.5–15.5)
WBC: 8 10*3/uL (ref 4.0–10.5)
nRBC: 0 % (ref 0.0–0.2)

## 2021-07-29 LAB — HIV-1/HIV-2 QUALITATIVE RNA
Final Interpretation: NEGATIVE
HIV-1 RNA, Qualitative: NONREACTIVE
HIV-2 RNA, Qualitative: NONREACTIVE

## 2021-07-29 LAB — GLUCOSE, CAPILLARY
Glucose-Capillary: 214 mg/dL — ABNORMAL HIGH (ref 70–99)
Glucose-Capillary: 294 mg/dL — ABNORMAL HIGH (ref 70–99)

## 2021-07-29 LAB — HIV-1/2 AB - DIFFERENTIATION
HIV 1 Ab: NONREACTIVE
HIV 2 Ab: NONREACTIVE
Note: NEGATIVE

## 2021-07-29 LAB — T-HELPER CELLS (CD4) COUNT (NOT AT ARMC)
CD4 % Helper T Cell: 39 % (ref 33–65)
CD4 T Cell Abs: 657 /uL (ref 400–1790)

## 2021-07-29 LAB — MAGNESIUM: Magnesium: 1.7 mg/dL (ref 1.7–2.4)

## 2021-07-29 LAB — LIPASE, BLOOD: Lipase: 198 U/L — ABNORMAL HIGH (ref 11–51)

## 2021-07-29 MED ORDER — POLYETHYLENE GLYCOL 3350 17 G PO PACK: 17.0000 g | PACK | Freq: Two times a day (BID) | ORAL | Status: DC | PRN

## 2021-07-29 MED ORDER — PANTOPRAZOLE SODIUM 40 MG PO TBEC
40.0000 mg | DELAYED_RELEASE_TABLET | Freq: Every day | ORAL | 1 refills | Status: DC
  Filled 2021-07-29: qty 30, 30d supply, fill #0

## 2021-07-29 MED ORDER — METOCLOPRAMIDE HCL 5 MG PO TABS
5.0000 mg | ORAL_TABLET | Freq: Three times a day (TID) | ORAL | 11 refills | Status: DC
  Filled 2021-07-29: qty 120, 30d supply, fill #0

## 2021-07-29 NOTE — Progress Notes (Signed)
Explained discharge instructions to patient. Reviewed necessity to schedule follow up appointment with her PCP. Number was provided. TOC medications were given to the patient at the time of discharge. Removed PIV. Patient was already off the monitor at the time of discharge. All belongings are in the patient's possession to include her purse and its contents. Patient verbalized having full understanding of her discharge instructions. No further needs verbalized. Transported down to the discharge lounge.

## 2021-07-29 NOTE — Plan of Care (Signed)
  Problem: Coping: Goal: Ability to adjust to condition or change in health will improve Outcome: Progressing   Problem: Fluid Volume: Goal: Ability to maintain a balanced intake and output will improve Outcome: Progressing   Problem: Health Behavior/Discharge Planning: Goal: Ability to manage health-related needs will improve Outcome: Progressing   Problem: Metabolic: Goal: Ability to maintain appropriate glucose levels will improve Outcome: Progressing   Problem: Nutritional: Goal: Maintenance of adequate nutrition will improve Outcome: Progressing   Problem: Skin Integrity: Goal: Risk for impaired skin integrity will decrease Outcome: Progressing   Problem: Tissue Perfusion: Goal: Adequacy of tissue perfusion will improve Outcome: Progressing   Problem: Clinical Measurements: Goal: Will remain free from infection Outcome: Progressing

## 2021-07-29 NOTE — TOC Initial Note (Signed)
Transition of Care [facility]) - Initial/Assessment Note    Patient Details  Name: [name] MRN: [id number] Date of Birth: 12/11/1995  Transition of Care [facility]) CM/SW Contact:    Carles Collet, RN Phone Number: 07/29/2021, 10:22 AM  Clinical Narrative:        Spoke w patient at bedside. She lives in extended stay hotel with her daughter. She verbalized having insulin pump and "slip up" forgetting to apply it as she managing her bags and her daughters bags to go to relatives house.  She denies barriers to access to healthcare. She utilized [employer] and friends for transportation.  Her primary Care is [facility] [facility] and she has Endocrinologist appointment on 6/27 with Dr Posey Pronto, she could not remember his full name. No TOC needs for DC identified         Expected Discharge Plan: Home/Self Care Barriers to Discharge: Continued Medical Work up   Patient Goals and CMS Choice Patient states their goals for this hospitalization and ongoing recovery are:: to return home   Choice offered to / list presented to : NA  Expected Discharge Plan and Services Expected Discharge Plan: Home/Self Care In-house Referral: Clinical Social Work Discharge Planning Services: CM Consult Post Acute Care Choice: NA Living arrangements for the past 2 months: Hotel/Motel                 DME Arranged: N/A           HH Agency: NA        Prior Living Arrangements/Services Living arrangements for the past 2 months: Hotel/Motel Lives with:: Minor Children   Do you feel safe going back to the place where you live?: Yes               Activities of Daily Living      Permission Sought/Granted                  Emotional Assessment              Admission diagnosis:  Diabetic ketoacidosis ([place]) [E11.10] AKI (acute kidney injury) ([place]) [N17.9] Diabetic ketoacidosis without coma associated with type 1 diabetes mellitus ([place]) [E10.10] Acute pancreatitis  without infection or necrosis [K85.90] Idiopathic acute pancreatitis, unspecified complication status [id number] Patient Active Problem List   Diagnosis Date Noted   Hypophosphatemia 07/28/2021   Acute pancreatitis without infection or necrosis 07/27/2021   Thyroid nodule 07/27/2021   Hypokalemia 07/27/2021   Diabetic ketoacidosis in type 1 diabetic  07/26/2021   GERD (gastroesophageal reflux disease) 07/26/2021   Hyperlipidemia 07/26/2021   Elevated lipase 07/26/2021   AKI (acute kidney injury) ([place]) 07/26/2021   Nausea and vomiting 07/26/2021   DKA, type 1, not at goal [facility]) 05/01/2017   Acute respiratory failure with hypoxia ([place]) 08/13/2012   Thyroiditis, autoimmune 04/13/2012   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 ([place]) 04/13/2012   Arrhythmia 04/13/2012   Depression 04/13/2012   Diabetic peripheral neuropathy ([place]) 04/13/2012   Diabetic hyperosmolar non-ketotic state ([place]) 04/04/2012   Hyponatremia 04/04/2012   Trichomoniasis 03/23/2012   Hydradenitis 03/23/2012   Short-term memory loss 04/30/2011   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) 02/07/2011    Class: Chronic   Altered mental status 02/07/2011   Asthma 02/07/2011    Class: Chronic   PCP:  Default, Provider, MD Pharmacy:   [id number] - [place], [place] [address] Phone: [phone] Fax: [facility], [place] - [address] [place] [address] Phone: [phone] Fax: [phone]  [facility] DR[facility]ORE [place], [place] [address] Phone: [phone] Fax: [phone]  [facility] DR[facility] STORE [place], [place] [address] Phone: [phone] Fax: [phone]  [facility] [address] [place] [address] Phone: [phone] Fax: [phone]     Social Determinants of Health (SDOH) Interventions    Readmission Risk Interventions     No data to display

## 2021-07-29 NOTE — Discharge Summary (Signed)
Physician Discharge Summary  [name] EFE:[id number] DOB: 02-27-1995 DOA: 07/26/2021  PCP: Default, Provider, MD  Admit date: 07/26/2021 Discharge date: 07/29/2021 Admitted From: Home Disposition: Home Recommendations for Outpatient Follow-up:  Follow up with PCP as below. Patient has upcoming appointment with her endocrinologist on 08/05/2021. Patient has been advised to call her endocrinologist office to clarify insulin dosage for her pump Please obtain CMP/CBC/lipase on discharge. Please follow up on the following pending results: None  Home Health: Not indicated Equipment/Devices: Not indicated  Discharge Condition: Stable CODE STATUS: Full code  Follow-up Information     Filomena Jungling, NP. Schedule an appointment as soon as possible for a visit in 1 week(s).   Specialty: Nurse Practitioner Why: For hospital follow up Contact information: [address] [place] [address] [phone]                 Hospital course 26 year old F with PMH of DM-1 with neuropathy, GERD and bipolar disorder presenting with nausea, vomiting, abdominal pain due to not using her insulin.  Reportedly, she forgot to take her insulin pump with her when she went to her mother's house.  She also tells me that her insulin pump is not working.    On arrival, vitals were stable.  pH 7.07.  Glucose 432.  CO2 8.  AG 22.  UA with 80 ketones.  Lipase 791. Cr 1.22.  CT abdomen and pelvis concerning for acute uncomplicated pancreatitis. She was started on insulin drip and IV fluid.   The next day, DKA and GI symptoms resolved.  She was transitioned to subcu insulin and started on clear liquid diet.   On the day of discharge, blood glucose improved.  She tolerated soft diet without problem.  She felt well and ready to go home.  She has been advised to reach out to her endocrinologist for her insulin pump dosage.  She has upcoming appointment with her PCP and endocrinologist later  this month.  See individual problem list below for more.   Problems addressed during this hospitalization Principal Problem:   Diabetic ketoacidosis in type 1 diabetic  Active Problems:   Acute pancreatitis without infection or necrosis   Elevated lipase   AKI (acute kidney injury) (HCC)   Nausea and vomiting   Goiter/hx of thyroiditis   Hyponatremia   Diabetic peripheral neuropathy (HCC)   GERD (gastroesophageal reflux disease)   Hyperlipidemia   Thyroid nodule   Hypokalemia   Hypophosphatemia   Uncontrolled type 1 diabetes with diabetic ketoacidosis, hyperglycemia and neuropathy: A1c 14.9%.  Suspect noncompliance.  Reportedly, she forgot to take her insulin pump when she went to visit her mother.  Patient is unsure of her insulin pump dosage.  Unable to retrieve from a insulin pump since the battery is dead. Recent Labs  Lab 2021/08/06 1213 Aug 06, 2021 1638 08-06-21 2124 07/29/21 0734 07/29/21 1143  GLUCAP 255* 230* 108* 214* 294*  -I advised to call endocrinologist office for her insulin pump dosage   Acute uncomplicated pancreatitis: CT abdomen concerning for acute pancreatitis.  Unclear etiology of this.  She denies drinking alcohol.  LFT and triglyceride within normal.  Lipase downtrending.  Symptoms resolved.  She tolerated soft diet.  Advised to continue soft diet.     AKI: Likely due to the above.  Resolved.  GERD -P.o. Protonix 40 mg daily   Thyroid nodule: TSH and free T4 within normal.  Thyroid US with single sub-1 cm right thyroid nodule which does not meet  TI-RADS criteria for biopsy nor follow-up.   Hypokalemia/hypophosphatemia: Resolved   Hyponatremia: Mild and stable.   History of bipolar disorder: Stable.  Does not seem to be on medication.   Positive HIV screen: Likely false positive.  Quantitative RNA and CD4 cell counts normal. Sexually active with 1 female partner.  Denies risky sexual behavior.  Cleared for discharge by ID   Obesity Body mass index is  30.27 kg/m. -Encourage lifestyle change to lose weight.            Vital signs Vitals:   07/28/21 2128 07/28/21 2342 07/29/21 0733 07/29/21 1140  BP: (!) 97/58 106/64 (!) 101/56 103/66  Pulse: 89 86 88 86  Temp: 98.5 F (36.9 C) 98.4 F (36.9 C) 98.3 F (36.8 C) 98.8 F (37.1 C)  Resp: Height:      Weight:      SpO2: 98% 100% 100% 100%  TempSrc: Oral Oral Oral Oral     Discharge exam  GENERAL: No apparent distress.  Nontoxic. HEENT: MMM.  Vision and hearing grossly intact.  NECK: Supple.  No apparent JVD.  RESP:  No IWOB.  Fair aeration bilaterally. CVS:  RRR. Heart sounds normal.  ABD/GI/GU: BS+. Abd soft, NTND.  MSK/EXT:  Moves extremities. No apparent deformity. No edema.  SKIN: no apparent skin lesion or wound NEURO: Awake and alert. Oriented appropriately.  No apparent focal neuro deficit. PSYCH: Calm. Normal affect.   Discharge Instructions Discharge Instructions     Call MD for:  extreme fatigue   Complete by: As directed    Call MD for:  persistant dizziness or light-headedness   Complete by: As directed    Call MD for:  persistant nausea and vomiting   Complete by: As directed    Call MD for:  severe uncontrolled pain   Complete by: As directed    Call MD for:  temperature >100.4   Complete by: As directed    Diet Carb Modified   Complete by: As directed    Discharge instructions   Complete by: As directed    It has been a pleasure taking care of you!  You were hospitalized due to diabetic ketoacidosis and acute pancreatitis.  You ketoacidosis resolved.  Pancreatitis improved.  Would recommend continuing soft diet over the next 2 to 3 days.  It is very important that you get your diabetic under good control to avoid diabetic complications. Please reach out to your endocrinologist as soon as you leave the hospital to see if your insulin dose need to be adjusted.  Keep yourself hydrated.    Take care,   Increase activity slowly    Complete by: As directed       Allergies as of 07/29/2021   No Known Allergies      Medication List     STOP taking these medications    omeprazole 40 MG capsule Commonly known as: PRILOSEC       TAKE these medications    BLOOD GLUCOSE METER DISPOSABLE Devi Use as directed   gabapentin 300 MG capsule Commonly known as: NEURONTIN Take 300 mg by mouth at bedtime.   insulin aspart 100 UNIT/ML FlexPen Commonly known as: NOVOLOG Inject 10 Units into the skin 3 (three) times daily with meals.   insulin aspart 100 UNIT/ML injection Commonly known as: novoLOG Inject 0-60 Units into the skin 3 (three) times daily as needed for high blood sugar.   Insulin Pen Needle 31G X 5 MM  Misc Use with insulin pen   Pen Needles 3/16" 31G X 5 MM Misc Use as directed   loratadine 10 MG tablet Commonly known as: CLARITIN Take 1 tablet (10 mg total) by mouth daily.   medroxyPROGESTERone 150 MG/ML injection Commonly known as: DEPO-PROVERA Inject 150 mg into the muscle every 3 (three) months.   metoCLOPramide 5 MG tablet Commonly known as: Reglan Take 1 tablet (5 mg total) by mouth 4 (four) times daily -  before meals and at bedtime.   pantoprazole 40 MG tablet Commonly known as: Protonix Take 1 tablet (40 mg total) by mouth daily.   pravastatin 40 MG tablet Commonly known as: PRAVACHOL Take 40 mg by mouth daily.        Consultations: Infectious disease  Procedures/Studies: None   CT ABDOMEN PELVIS WO CONTRAST  Result Date: 07/26/2021 CLINICAL DATA:  Pancreatitis, nausea and vomiting onset this morning, abdominal pain EXAM: CT ABDOMEN AND PELVIS WITHOUT CONTRAST TECHNIQUE: Multidetector CT imaging of the abdomen and pelvis was performed following the standard protocol without IV contrast. RADIATION DOSE REDUCTION: This exam was performed according to the departmental dose-optimization program which includes automated exposure control, adjustment of the mA and/or kV  according to patient size and/or use of iterative reconstruction technique. COMPARISON:  None Available. FINDINGS: Lower chest: No acute abnormality. Hepatobiliary: No solid liver abnormality is seen. No gallstones, gallbladder wall thickening, or biliary dilatation. Pancreas: Examination of the upper abdomen is generally quite limited by breath motion artifact and lack of intravenous contrast. Within this limitation, there appears to be peripancreatic edema and a small volume of fluid (series 3, image 28). No obvious acute pancreatic fluid collection. Spleen: Normal in size without significant abnormality. Adrenals/Urinary Tract: Adrenal glands are unremarkable. Kidneys are normal, without renal calculi, solid lesion, or hydronephrosis. Bladder is unremarkable. Stomach/Bowel: Stomach is within normal limits. Normal appendix. No evidence of bowel wall thickening, distention, or inflammatory changes. Moderate burden of stool and stool balls throughout the colon and rectum. Vascular/Lymphatic: No significant vascular findings are present. No enlarged abdominal or pelvic lymph nodes. Reproductive: No mass or other significant abnormality. Other: No abdominal wall hernia or abnormality. No ascites. Musculoskeletal: No acute or significant osseous findings. IMPRESSION: Examination of the upper abdomen is generally quite limited by breath motion artifact and lack of intravenous contrast. Within this limitation, there appears to be peripancreatic edema and a small volume of fluid. No obvious acute pancreatic fluid collection. Findings are consistent with acute pancreatitis. Contrast enhanced examination with better breath motion control may be helpful to assess further if complication is suspected. Electronically Signed   By: Jearld LeschAlex D Bibbey M.D.   On: 07/26/2021 19:14   US THYROID  Result Date: 07/26/2021 CLINICAL DATA:  Goiter. EXAM: THYROID ULTRASOUND TECHNIQUE: Ultrasound examination of the thyroid gland and adjacent  soft tissues was performed. COMPARISON:  US Thyroid, 09/21/2016. FINDINGS: Parenchymal Echotexture: Normal Isthmus: 0.3 cm Right lobe: 4.8 x 1.6 x 1.4 cm Left lobe: 4.2 x 1.0 x 1.4 cm _________________________________________________________ Estimated total number of nodules >/= 1 cm: 0 Number of spongiform nodules >/=  2 cm not described below (TR1): 0 Number of mixed cystic and solid nodules >/= 1.5 cm not described below (TR2): 0 _________________________________________________________ Sub-1 cm RIGHT mid TR 4 thyroid nodule with maximum dimension of 0.6 cm. Given size (<0.9 cm) and appearance, this nodule does NOT meet TI-RADS criteria for biopsy or dedicated follow-up. No cervical adenopathy or abnormal fluid collection within the imaged neck. IMPRESSION: 1. Normal nonenlarged  thyroid. 2. Single sub-1 cm RIGHT thyroid nodule which does not meet TI-RADS criteria for biopsy nor follow-up. The above is in keeping with the ACR TI-RADS recommendations - J Am Coll Radiol 2017;14:587-595. Electronically Signed   By: Roanna Banning M.D.   On: 07/26/2021 18:19   DG CHEST PORT 1 VIEW  Result Date: 07/26/2021 CLINICAL DATA:  Abdominal pain and vomiting. EXAM: PORTABLE CHEST 1 VIEW COMPARISON:  March 25, 2017 FINDINGS: The heart size and mediastinal contours are within normal limits. Both lungs are clear. The visualized skeletal structures are unremarkable. IMPRESSION: No active disease. Electronically Signed   By: Aram Candela M.D.   On: 07/26/2021 16:34       The results of significant diagnostics from this hospitalization (including imaging, microbiology, ancillary and laboratory) are listed below for reference.     Microbiology: No results found for this or any previous visit (from the past 240 hour(s)).   Labs:  CBC: Recent Labs  Lab 07/26/21 0957 07/26/21 1458 07/27/21 1124 07/28/21 0154 07/29/21 0036  WBC 9.8  --  8.9 9.3 8.0  HGB 11.6* 14.3 10.2* 10.9* 10.1*  HCT 37.1 42.0 32.7*  34.9* 31.3*  MCV 83.0  --  80.5 81.4 80.1  PLT 298  --  257 274 270   BMP &GFR Recent Labs  Lab 07/27/21 0123 07/27/21 0823 07/27/21 0833 07/27/21 1124 07/28/21 0154 07/29/21 0036  NA 137  --  135 136 133* 134*  K 3.8  --  3.3* 3.0* 4.4 3.6  CL 109  --  110 110 107 104  CO2 15*  --  18* 20* 18* 23  GLUCOSE 228*  --  164* 131* 249* 178*  BUN 11  --  CREATININE 0.99  --  0.63 0.58 0.72 0.60  CALCIUM 9.8  --  9.5 9.3 9.3 9.0  MG  --  2.1  --   --  1.8 1.7  PHOS  --   --  1.4* 1.3* 1.8* 2.4*   Estimated Creatinine Clearance: 107.1 mL/min (by C-G formula based on SCr of 0.6 mg/dL). Liver & Pancreas: Recent Labs  Lab 07/26/21 1504 07/27/21 0833 07/27/21 1124 07/28/21 0154 07/29/21 0036  AST 13*  --   --  15  --   ALT 13  --   --  13  --   ALKPHOS 81  --   --  63  --   BILITOT 1.2  --   --  1.1  --   PROT 8.6*  --   --  7.7  --   ALBUMIN 3.9 3.4* 3.2* 3.5 3.0*   Recent Labs  Lab 07/26/21 0957 07/27/21 0123 07/28/21 0154 07/29/21 0036  LIPASE 791* 688* 262* 198*   No results for input(s): "AMMONIA" in the last 168 hours. Diabetic: Recent Labs    07/26/21 1631  HGBA1C 14.9*   Recent Labs  Lab 07/28/21 1213 07/28/21 1638 07/28/21 2124 07/29/21 0734 07/29/21 1143  GLUCAP 255* 230* 108* 214* 294*   Cardiac Enzymes: No results for input(s): "CKTOTAL", "CKMB", "CKMBINDEX", "TROPONINI" in the last 168 hours. No results for input(s): "PROBNP" in the last 8760 hours. Coagulation Profile: No results for input(s): "INR", "PROTIME" in the last 168 hours. Thyroid Function Tests: Recent Labs    07/27/21 0123  TSH 1.259  FREET4 0.75   Lipid Profile: Recent Labs    07/27/21 0123  CHOL 202*  HDL 69  LDLCALC 115*  TRIG 88  CHOLHDL 2.9  Anemia Panel: No results for input(s): "VITAMINB12", "FOLATE", "FERRITIN", "TIBC", "IRON", "RETICCTPCT" in the last 72 hours. Urine analysis:    Component Value Date/Time   COLORURINE STRAW (A) 07/26/2021 0957    APPEARANCEUR CLEAR 07/26/2021 0957   LABSPEC 1.028 07/26/2021 0957   PHURINE 5.0 07/26/2021 0957   GLUCOSEU >=500 (A) 07/26/2021 0957   HGBUR SMALL (A) 07/26/2021 0957   BILIRUBINUR NEGATIVE 07/26/2021 0957   KETONESUR 80 (A) 07/26/2021 0957   PROTEINUR 30 (A) 07/26/2021 0957   UROBILINOGEN 0.2 07/28/2013 1729   NITRITE NEGATIVE 07/26/2021 0957   LEUKOCYTESUR NEGATIVE 07/26/2021 0957   Sepsis Labs: Invalid input(s): "PROCALCITONIN", "LACTICIDVEN"   SIGNED:  Almon Hercules, MD  [facility] 07/29/2021, 4:07 PM

## 2021-07-29 NOTE — Plan of Care (Signed)
  Problem: Education: Goal: Ability to describe self-care measures that may prevent or decrease complications (Diabetes Survival Skills Education) will improve Outcome: Adequate for Discharge Goal: Individualized Educational Video(s) Outcome: Adequate for Discharge   Problem: Coping: Goal: Ability to adjust to condition or change in health will improve Outcome: Adequate for Discharge   Problem: Fluid Volume: Goal: Ability to maintain a balanced intake and output will improve Outcome: Adequate for Discharge   Problem: Health Behavior/Discharge Planning: Goal: Ability to identify and utilize available resources and services will improve Outcome: Adequate for Discharge Goal: Ability to manage health-related needs will improve Outcome: Adequate for Discharge   Problem: Metabolic: Goal: Ability to maintain appropriate glucose levels will improve Outcome: Adequate for Discharge   Problem: Nutritional: Goal: Maintenance of adequate nutrition will improve Outcome: Adequate for Discharge Goal: Progress toward achieving an optimal weight will improve Outcome: Adequate for Discharge   Problem: Skin Integrity: Goal: Risk for impaired skin integrity will decrease Outcome: Adequate for Discharge   Problem: Tissue Perfusion: Goal: Adequacy of tissue perfusion will improve Outcome: Adequate for Discharge   Problem: Health Behavior/Discharge Planning: Goal: Ability to manage health-related needs will improve Outcome: Adequate for Discharge   Problem: Clinical Measurements: Goal: Ability to maintain clinical measurements within normal limits will improve Outcome: Adequate for Discharge Goal: Will remain free from infection Outcome: Adequate for Discharge Goal: Diagnostic test results will improve Outcome: Adequate for Discharge Goal: Respiratory complications will improve Outcome: Adequate for Discharge Goal: Cardiovascular complication will be avoided Outcome: Adequate for  Discharge

## 2022-06-15 ENCOUNTER — Inpatient Hospital Stay (HOSPITAL_COMMUNITY)
Admission: EM | Admit: 2022-06-15 | Discharge: 2022-06-17 | DRG: 638 | Disposition: A | Discharge status: Home / Self Care | Payer: Medicaid Other | Attending: Internal Medicine | Admitting: Internal Medicine

## 2022-06-15 ENCOUNTER — Encounter (HOSPITAL_COMMUNITY): Payer: Self-pay

## 2022-06-15 DIAGNOSIS — Z818 Family history of other mental and behavioral disorders: Secondary | ICD-10-CM | POA: Diagnosis not present

## 2022-06-15 DIAGNOSIS — E1011 Type 1 diabetes mellitus with ketoacidosis with coma: Principal | ICD-10-CM | POA: Diagnosis present

## 2022-06-15 DIAGNOSIS — E861 Hypovolemia: Secondary | ICD-10-CM | POA: Diagnosis present

## 2022-06-15 DIAGNOSIS — D649 Anemia, unspecified: Secondary | ICD-10-CM | POA: Diagnosis present

## 2022-06-15 DIAGNOSIS — K3184 Gastroparesis: Secondary | ICD-10-CM | POA: Diagnosis present

## 2022-06-15 DIAGNOSIS — F17210 Nicotine dependence, cigarettes, uncomplicated: Secondary | ICD-10-CM | POA: Diagnosis present

## 2022-06-15 DIAGNOSIS — F419 Anxiety disorder, unspecified: Secondary | ICD-10-CM | POA: Diagnosis present

## 2022-06-15 DIAGNOSIS — K219 Gastro-esophageal reflux disease without esophagitis: Secondary | ICD-10-CM | POA: Diagnosis present

## 2022-06-15 DIAGNOSIS — E1043 Type 1 diabetes mellitus with diabetic autonomic (poly)neuropathy: Secondary | ICD-10-CM | POA: Diagnosis present

## 2022-06-15 DIAGNOSIS — F819 Developmental disorder of scholastic skills, unspecified: Secondary | ICD-10-CM | POA: Diagnosis present

## 2022-06-15 DIAGNOSIS — E109 Type 1 diabetes mellitus without complications: Secondary | ICD-10-CM | POA: Diagnosis not present

## 2022-06-15 DIAGNOSIS — J45909 Unspecified asthma, uncomplicated: Secondary | ICD-10-CM | POA: Diagnosis present

## 2022-06-15 DIAGNOSIS — Z794 Long term (current) use of insulin: Secondary | ICD-10-CM | POA: Diagnosis not present

## 2022-06-15 DIAGNOSIS — R7989 Other specified abnormal findings of blood chemistry: Secondary | ICD-10-CM | POA: Diagnosis present

## 2022-06-15 DIAGNOSIS — Z9641 Presence of insulin pump (external) (internal): Secondary | ICD-10-CM | POA: Diagnosis present

## 2022-06-15 DIAGNOSIS — Z79899 Other long term (current) drug therapy: Secondary | ICD-10-CM | POA: Diagnosis not present

## 2022-06-15 DIAGNOSIS — Z91148 Patient's other noncompliance with medication regimen for other reason: Secondary | ICD-10-CM | POA: Diagnosis not present

## 2022-06-15 DIAGNOSIS — E1010 Type 1 diabetes mellitus with ketoacidosis without coma: Secondary | ICD-10-CM | POA: Diagnosis present

## 2022-06-15 DIAGNOSIS — E785 Hyperlipidemia, unspecified: Secondary | ICD-10-CM | POA: Diagnosis present

## 2022-06-15 DIAGNOSIS — N179 Acute kidney failure, unspecified: Secondary | ICD-10-CM | POA: Diagnosis present

## 2022-06-15 DIAGNOSIS — Z825 Family history of asthma and other chronic lower respiratory diseases: Secondary | ICD-10-CM | POA: Diagnosis not present

## 2022-06-15 LAB — BASIC METABOLIC PANEL
Anion gap: 23 — ABNORMAL HIGH (ref 5–15)
BUN: 19 mg/dL (ref 6–20)
CO2: 10 mmol/L — ABNORMAL LOW (ref 22–32)
Calcium: 8.3 mg/dL — ABNORMAL LOW (ref 8.9–10.3)
Chloride: 92 mmol/L — ABNORMAL LOW (ref 98–111)
Creatinine, Ser: 1.35 mg/dL — ABNORMAL HIGH (ref 0.44–1.00)
GFR, Estimated: 55 mL/min — ABNORMAL LOW (ref >60)
Glucose, Bld: 894 mg/dL (ref 70–99)
Potassium: 5 mmol/L (ref 3.5–5.1)
Sodium: 125 mmol/L — ABNORMAL LOW (ref 135–145)

## 2022-06-15 LAB — CBC WITH DIFFERENTIAL/PLATELET
Abs Immature Granulocytes: 0.01 10*3/uL (ref 0.00–0.07)
Basophils Absolute: 0 10*3/uL (ref 0.0–0.1)
Basophils Relative: 0 %
Eosinophils Absolute: 0.1 10*3/uL (ref 0.0–0.5)
Eosinophils Relative: 1 %
HCT: 33.3 % — ABNORMAL LOW (ref 36.0–46.0)
Hemoglobin: 9.8 g/dL — ABNORMAL LOW (ref 12.0–15.0)
Immature Granulocytes: 0 %
Lymphocytes Relative: 38 %
Lymphs Abs: 2.2 10*3/uL (ref 0.7–4.0)
MCH: 25.2 pg — ABNORMAL LOW (ref 26.0–34.0)
MCHC: 29.4 g/dL — ABNORMAL LOW (ref 30.0–36.0)
MCV: 85.6 fL (ref 80.0–100.0)
Monocytes Absolute: 0.5 10*3/uL (ref 0.1–1.0)
Monocytes Relative: 8 %
Neutro Abs: 3.1 10*3/uL (ref 1.7–7.7)
Neutrophils Relative %: 53 %
Platelets: 333 10*3/uL (ref 150–400)
RBC: 3.89 MIL/uL (ref 3.87–5.11)
RDW: 15.4 % (ref 11.5–15.5)
WBC: 5.8 10*3/uL (ref 4.0–10.5)
nRBC: 0 % (ref 0.0–0.2)

## 2022-06-15 LAB — URINALYSIS, ROUTINE W REFLEX MICROSCOPIC
Bilirubin Urine: NEGATIVE
Glucose, UA: >=500 mg/dL — AB
Hgb urine dipstick: NEGATIVE
Ketones, ur: 20 mg/dL — AB
Leukocytes,Ua: NEGATIVE
Nitrite: NEGATIVE
Protein, ur: NEGATIVE mg/dL
Specific Gravity, Urine: 1.021 (ref 1.005–1.030)
pH: 5 (ref 5.0–8.0)

## 2022-06-15 LAB — I-STAT CHEM 8, ED
BUN: 18 mg/dL (ref 6–20)
Calcium, Ion: 1.16 mmol/L (ref 1.15–1.40)
Chloride: 96 mmol/L — ABNORMAL LOW (ref 98–111)
Creatinine, Ser: 0.8 mg/dL (ref 0.44–1.00)
Glucose, Bld: >700 mg/dL (ref 70–99)
HCT: 37 % (ref 36.0–46.0)
Hemoglobin: 12.6 g/dL (ref 12.0–15.0)
Potassium: 5.2 mmol/L — ABNORMAL HIGH (ref 3.5–5.1)
Sodium: 126 mmol/L — ABNORMAL LOW (ref 135–145)
TCO2: 14 mmol/L — ABNORMAL LOW (ref 22–32)

## 2022-06-15 LAB — CBG MONITORING, ED: Glucose-Capillary: >600 mg/dL (ref 70–99)

## 2022-06-15 LAB — HCG, QUANTITATIVE, PREGNANCY: hCG, Beta Chain, Quant, S: 1 m[IU]/mL (ref <5)

## 2022-06-15 MED ORDER — INSULIN REGULAR(HUMAN) IN NACL 100-0.9 UT/100ML-% IV SOLN
INTRAVENOUS | Status: DC
  Administered 2022-06-16: 9 [IU]/h via INTRAVENOUS
  Filled 2022-06-15: qty 100

## 2022-06-15 MED ORDER — DEXTROSE IN LACTATED RINGERS 5 % IV SOLN: INTRAVENOUS | Status: DC

## 2022-06-15 MED ORDER — LACTATED RINGERS IV SOLN: INTRAVENOUS | Status: DC

## 2022-06-15 MED ORDER — LACTATED RINGERS IV BOLUS
2000.0000 mL | Freq: Once | INTRAVENOUS | Status: AC
  Administered 2022-06-15: 2000 mL via INTRAVENOUS

## 2022-06-15 MED ORDER — DEXTROSE 50 % IV SOLN: 0.0000 mL | INTRAVENOUS | Status: DC | PRN

## 2022-06-15 NOTE — ED Triage Notes (Addendum)
Pt arrived POV for abd pain and increased frequency in urination. Pt reports increased thirst, has went through "2 jugs of juices". Reports sugar reading "HIGH" at home. Pt was seen at [facility] in [facility], advised to come to ED for further evaluation.  +Ketones in urine at UC, and urine glucose >1000. VSS, NAD noted, A&O x4.   CBG "HIGH" in triage. Pt has type I diabetes.

## 2022-06-15 NOTE — ED Notes (Signed)
Patient took off insulin pump this evening around 3pm. States she hadn't put it back on due to time and and needing to charge it.

## 2022-06-15 NOTE — ED Notes (Signed)
Pt. I-stat Chem 8 results glucose greater than 700, MD. Made aware.

## 2022-06-15 NOTE — ED Notes (Signed)
IV team at bedside at this time. 

## 2022-06-15 NOTE — H&P (Signed)
History and Physical    [name] WUJ:[id number] DOB: 1995/05/24 DOA: 06/15/2022  PCP: Default, Provider, MD  Patient coming from: Home  I have personally briefly reviewed patient's old medical records in [facility]  Chief Complaint: Hyperglycemia  HPI: [name] is a 27 y.o. female with medical history significant for type 1 diabetes using insulin pump, diabetic neuropathy, HLD, GERD who presented to the ED for evaluation of hyperglycemia.  Patient reports developing increased urinary frequency and thirst today.  She has had nausea, vomiting, and some abdominal pain.  She says that she is not able to keep down food today.  She normally uses an insulin pump but reported that she took it off around 3 PM.  She says insulin pump has been functioning well.  She also reported drinking 2 jugs of juice due to feeling so thirsty.  She denies fevers, chills, diaphoresis, chest pain, dyspnea, diarrhea.  She follows with endocrinology, Dr. Crista Curb, next appointment 07/08/2022.  ED Course  Labs/Imaging on admission: I have personally reviewed following labs and imaging studies.  Initial vitals showed BP 132/95, pulse 92, RR 16, temp 98.3 F, SpO2 98% on room air.  Labs show WBC 5.8, hemoglobin 9.8, platelets 333,000, serum glucose 894, bicarb 10, BUN 19, creatinine 1.35, anion gap 23, sodium 125 (144 when corrected for hyperglycemia), potassium 5.0.  Urinalysis shows >500 glucose, 20 ketones, negative nitrites, negative leukocytes, 0-5 RBCs and WBCs, rare bacteria microscopy.  Patient was given 2 L LR and started on insulin infusion.  The hospitalist service was consulted to admit for further evaluation and management.  Review of Systems: All systems reviewed and are negative except as documented in history of present illness above.   Past Medical History:  Diagnosis Date   Allergy    dust    Anxiety    Asthma    Diabetes mellitus    Insulin Dependant   Learning  disability    Lupus (HCC)    Urinary tract infection    Vision abnormalities    wears glasses    Past Surgical History:  Procedure Laterality Date   NO PAST SURGERIES      Social History:  reports that she has been smoking cigarettes. She started smoking about 9 years ago. She has been smoking an average of .5 packs per day. She has never used smokeless tobacco. She reports that she does not drink alcohol and does not use drugs.  No Known Allergies  Family History  Adopted: Yes  Problem Relation Age of Onset   Early death Sister    Asthma Maternal Aunt    Depression Maternal Aunt    Mental illness Maternal Aunt    Asthma Maternal Grandmother    Thyroid disease Maternal Grandmother    Obesity Maternal Grandmother    Heart disease Neg Hx    Diabetes Neg Hx      Prior to Admission medications   Medication Sig Start Date End Date Taking? Authorizing Provider  Blood Gluc Meter Disp-Strips (BLOOD GLUCOSE METER DISPOSABLE) DEVI Use as directed 07/30/13   Hollice Espy, MD  gabapentin (NEURONTIN) 300 MG capsule Take 300 mg by mouth at bedtime. 02/20/21   [provider]  insulin aspart (NOVOLOG) 100 UNIT/ML FlexPen Inject 10 Units into the skin 3 (three) times daily with meals. Patient not taking: Reported on 07/26/2021 07/30/13   Hollice Espy, MD  insulin aspart (NOVOLOG) 100 UNIT/ML injection Inject 0-60 Units into the skin 3 (three) times daily  as needed for high blood sugar. 07/10/21   [provider]  Insulin Pen Needle (PEN NEEDLES 3/16") 31G X 5 MM MISC Use as directed 07/30/13   Hollice Espy, MD  Insulin Pen Needle 31G X 5 MM MISC Use with insulin pen 08/17/12   David Stall, MD  loratadine (CLARITIN) 10 MG tablet Take 1 tablet (10 mg total) by mouth daily. Patient not taking: Reported on 07/26/2021 07/30/13   Hollice Espy, MD  medroxyPROGESTERone (DEPO-PROVERA) 150 MG/ML injection Inject 150 mg into the muscle every 3 (three) months.  05/25/21   [provider]  metoCLOPramide (REGLAN) 5 MG tablet Take 1 tablet (5 mg total) by mouth 4 (four) times daily -  before meals and at bedtime. 07/29/21 07/29/22  Almon Hercules, MD  pantoprazole (PROTONIX) 40 MG tablet Take 1 tablet (40 mg total) by mouth daily. 07/29/21 09/27/21  Almon Hercules, MD  pravastatin (PRAVACHOL) 40 MG tablet Take 40 mg by mouth daily. 07/14/21   [provider]    Physical Exam: Vitals:   06/15/22 2204  BP: (!) 132/95  Pulse: 92  Resp: 16  Temp: 98.3 F (36.8 C)  TempSrc: Oral  SpO2: 98%  Weight: 75.8 kg  Height: 5\' 4"  (1.626 m)   Constitutional: Resting in bed, NAD, calm, comfortable Eyes: EOMI, lids and conjunctivae normal ENMT: Mucous membranes are moist. Posterior pharynx clear of any exudate or lesions.Normal dentition.  Neck: normal, supple, no masses. Respiratory: clear to auscultation anteriorly.  Normal respiratory effort. No accessory muscle use.  Cardiovascular: Regular rate and rhythm, no murmurs / rubs / gallops. No extremity edema. 2+ pedal pulses. Abdomen: no tenderness, no masses palpated.  Musculoskeletal: no clubbing / cyanosis. No joint deformity upper and lower extremities. Good ROM, no contractures. Normal muscle tone.  Skin: no rashes, lesions, ulcers. No induration Neurologic:Sensation intact. Strength 5/5 in all 4.  Psychiatric: Alert and oriented x 3. Normal mood.   EKG: Not performed.  Assessment/Plan Principal Problem:   Diabetic ketoacidosis without coma associated with type 1 diabetes mellitus (HCC) Active Problems:   AKI (acute kidney injury) (HCC)   Normocytic anemia   Pseudohyponatremia   Charlina [name] is a 27 y.o. female with medical history significant for type 1 diabetes using insulin pump, diabetic neuropathy, HLD, GERD who is admitted with DKA.  Assessment and Plan: * Diabetic ketoacidosis without coma associated with type 1 diabetes mellitus (HCC) Serum glucose 894 with bicarb 10, anion  gap 23, pH 7.2 on VBG.  She is mentating well.  No obvious inciting factor, question dietary and/or medication adherence. -Continue insulin infusion per protocol -Continue IV fluids as ordered -Follow serial BMET -Keep n.p.o.  AKI (acute kidney injury) (HCC) Mild in setting of hypovolemia.  Continue IV fluids and follow repeat labs.  Normocytic anemia Hemoglobin stable.  Continue to monitor.  Pseudohyponatremia In setting of hyperglycemia.  Corrected sodium 144.  DVT prophylaxis: enoxaparin (LOVENOX) injection 40 mg Start: 06/16/22 1000 Code Status: Full code Family Communication: Discussed with patient, she has discussed with family Disposition Plan: From home and likely discharge to home pending clinical progress Consults called: None Severity of Illness: The appropriate patient status for this patient is INPATIENT. Inpatient status is judged to be reasonable and necessary in order to provide the required intensity of service to ensure the patient's safety. The patient's presenting symptoms, physical exam findings, and initial radiographic and laboratory data in the context of their chronic comorbidities is felt to place them  at high risk for further clinical deterioration. Furthermore, it is not anticipated that the patient will be medically stable for discharge from the hospital within 2 midnights of admission.   * I certify that at the point of admission it is my clinical judgment that the patient will require inpatient hospital care spanning beyond 2 midnights from the point of admission due to high intensity of service, high risk for further deterioration and high frequency of surveillance required.Darreld Mclean MD [facility]  If 7PM-7AM, please contact night-coverage [email or web address]  06/16/2022, 12:19 AM

## 2022-06-15 NOTE — ED Notes (Signed)
Unsuccessful [place] PIV attempt x2 in L FA and Upper Arm.

## 2022-06-15 NOTE — ED Provider Notes (Signed)
[facility]-EMERGENCY DEPT [facility] Emergency Department Provider Note MRN:  [id number]  Arrival date & time: 06/15/22     Chief Complaint   Hyperglycemia   History of Present Illness   [name] is a 27 y.o. year-old female presents to the ED with chief complaint of hyperglycemia.  She has hx of type 1 DM.  She states that she normally uses an insulin pump, but she took it off at 3pm today.  She states that over the past day or so she has had increasing polydipsia and polyuria.  She denies any recent illness.  Denies fever, chills, or cough.  States that she has had some vomiting today.    She states that she felt so thirsty today she drank "2 jugs of juice."  Was seen at outside facility and referred to the ER.  History provided by patient.   Review of Systems  Pertinent positive and negative review of systems noted in HPI.    Physical Exam   Vitals:   06/15/22 2204  BP: (!) 132/95  Pulse: 92  Resp: 16  Temp: 98.3 F (36.8 C)  SpO2: 98%    CONSTITUTIONAL:  non toxic-appearing, NAD NEURO:  Alert and oriented x 3, CN 3-12 grossly intact EYES:  eyes equal and reactive ENT/NECK:  Supple, no stridor  CARDIO:  normal rate, regular rhythm, appears well-perfused  PULM:  No respiratory distress,  GI/GU:  non-distended,  MSK/SPINE:  No gross deformities, no edema, moves all extremities  SKIN:  no rash, atraumatic   *Additional and/or pertinent findings included in MDM below  Diagnostic and Interventional Summary    EKG Interpretation  Date/Time:    Ventricular Rate:    PR Interval:    QRS Duration:   QT Interval:    QTC Calculation:   R Axis:     Text Interpretation:         Labs Reviewed  CBC WITH DIFFERENTIAL/PLATELET - Abnormal; Notable for the following components:      Result Value   Hemoglobin 9.8 (*)    HCT 33.3 (*)    MCH 25.2 (*)    MCHC 29.4 (*)    All other components within normal limits  BASIC METABOLIC PANEL - Abnormal; Notable for the  following components:   Sodium 125 (*)    Chloride 92 (*)    CO2 10 (*)    Glucose, Bld 894 (*)    Creatinine, Ser 1.35 (*)    Calcium 8.3 (*)    GFR, Estimated 55 (*)    Anion gap 23 (*)    All other components within normal limits  URINALYSIS, ROUTINE W REFLEX MICROSCOPIC - Abnormal; Notable for the following components:   Color, Urine COLORLESS (*)    Glucose, UA >=500 (*)    Ketones, ur 20 (*)    Bacteria, UA RARE (*)    All other components within normal limits  CBG MONITORING, ED - Abnormal; Notable for the following components:   Glucose-Capillary >600 (*)    All other components within normal limits  I-STAT CHEM 8, ED - Abnormal; Notable for the following components:   Sodium 126 (*)    Potassium 5.2 (*)    Chloride 96 (*)    Glucose, Bld >700 (*)    TCO2 14 (*)    All other components within normal limits  BLOOD GAS, VENOUS  BETA-HYDROXYBUTYRIC ACID  HCG, QUANTITATIVE, PREGNANCY  CBG MONITORING, ED    No orders to display    Medications  insulin regular, human (MYXREDLIN) 100 units/ 100 mL infusion (has no administration in time range)  lactated ringers infusion (has no administration in time range)  dextrose 5 % in lactated ringers infusion (has no administration in time range)  dextrose 50 % solution 0-50 mL (has no administration in time range)  lactated ringers bolus 2,000 mL (2,000 mLs Intravenous New Bag/Given 06/15/22 2331)     Procedures  /  Critical Care .Critical Care  Performed by: Roxy Horseman, PA-C Authorized by: Roxy Horseman, PA-C   Critical care provider statement:    Critical care time (minutes):  76   Critical care was necessary to treat or prevent imminent or life-threatening deterioration of the following conditions:  Metabolic crisis   Critical care was time spent personally by me on the following activities:  Development of treatment plan with patient or surrogate, discussions with consultants, evaluation of patient's response to  treatment, examination of patient, ordering and review of laboratory studies, ordering and review of radiographic studies, ordering and performing treatments and interventions, pulse oximetry, re-evaluation of patient's condition and review of old charts Ultrasound ED Peripheral IV (Provider)  Date/Time: 06/15/2022 11:43 PM  Performed by: Roxy Horseman, PA-C Authorized by: Roxy Horseman, PA-C   Procedure details:    Indications: hydration, multiple failed IV attempts and poor IV access     Skin Prep: chlorhexidine gluconate     Location:  Right AC   Angiocath:  20 G   Bedside Ultrasound Guided: Yes     Images: archived     Patient tolerated procedure without complications: Yes     Dressing applied: Yes     ED Course and Medical Decision Making  I have reviewed the triage vital signs, the nursing notes, and pertinent available records from the EMR.  Social Determinants Affecting Complexity of Care: Patient has no clinically significant social determinants affecting this chief complaint..   ED Course:    Medical Decision Making Patient here with elevated glucose.  She is type I diabetic.  Has been drinking a lot of juice and not using her insulin pump.  Anion gap is 23, glucose is in the 800s.  Will treat for DKA.  Patient is alert and oriented, does not seem confused.  Will start fluids and insulin.  Potassium is 5.2, no supplemental needed.  RN and staff were unable to obtain IV access.  I placed an ultrasound-guided IV, see procedure note.    Amount and/or Complexity of Data Reviewed Labs: ordered.    Details: Glucose greater than 700, anion gap 23 No leukocytosis  Risk Prescription drug management. Decision regarding hospitalization.     Consultants: I consulted with Hospitalist, Dr. Allena Katz, who is appreciated for admitting.   Treatment and Plan: Patient's exam and diagnostic results are concerning for DKA.  Feel that patient will need admission to the  hospital for further treatment and evaluation.    Final Clinical Impressions(s) / ED Diagnoses     ICD-10-CM   1. Diabetic ketoacidosis without coma associated with type 1 diabetes mellitus (HCC)  E10.10       ED Discharge Orders     None         Discharge Instructions Discussed with and Provided to Patient:   Discharge Instructions   None      Roxy Horseman, PA-C 06/15/22 2352    Melene Plan, DO 06/16/22 1505

## 2022-06-16 ENCOUNTER — Other Ambulatory Visit: Payer: Self-pay

## 2022-06-16 DIAGNOSIS — N179 Acute kidney failure, unspecified: Secondary | ICD-10-CM

## 2022-06-16 DIAGNOSIS — E1010 Type 1 diabetes mellitus with ketoacidosis without coma: Secondary | ICD-10-CM

## 2022-06-16 LAB — BASIC METABOLIC PANEL
Anion gap: 20 — ABNORMAL HIGH (ref 5–15)
Anion gap: 12 (ref 5–15)
Anion gap: 12 (ref 5–15)
Anion gap: 11 (ref 5–15)
Anion gap: 10 (ref 5–15)
BUN: 17 mg/dL (ref 6–20)
BUN: 13 mg/dL (ref 6–20)
BUN: 9 mg/dL (ref 6–20)
BUN: 6 mg/dL (ref 6–20)
BUN: 6 mg/dL (ref 6–20)
CO2: 13 mmol/L — ABNORMAL LOW (ref 22–32)
CO2: 21 mmol/L — ABNORMAL LOW (ref 22–32)
CO2: 21 mmol/L — ABNORMAL LOW (ref 22–32)
CO2: 20 mmol/L — ABNORMAL LOW (ref 22–32)
CO2: 23 mmol/L (ref 22–32)
Calcium: 8.6 mg/dL — ABNORMAL LOW (ref 8.9–10.3)
Calcium: 8.6 mg/dL — ABNORMAL LOW (ref 8.9–10.3)
Calcium: 8.5 mg/dL — ABNORMAL LOW (ref 8.9–10.3)
Calcium: 8.4 mg/dL — ABNORMAL LOW (ref 8.9–10.3)
Calcium: 8.8 mg/dL — ABNORMAL LOW (ref 8.9–10.3)
Chloride: 100 mmol/L (ref 98–111)
Chloride: 101 mmol/L (ref 98–111)
Chloride: 102 mmol/L (ref 98–111)
Chloride: 102 mmol/L (ref 98–111)
Chloride: 102 mmol/L (ref 98–111)
Creatinine, Ser: 1.12 mg/dL — ABNORMAL HIGH (ref 0.44–1.00)
Creatinine, Ser: 0.93 mg/dL (ref 0.44–1.00)
Creatinine, Ser: 0.77 mg/dL (ref 0.44–1.00)
Creatinine, Ser: 0.76 mg/dL (ref 0.44–1.00)
Creatinine, Ser: 0.69 mg/dL (ref 0.44–1.00)
GFR, Estimated: >60 mL/min (ref >60)
GFR, Estimated: >60 mL/min (ref >60)
GFR, Estimated: >60 mL/min (ref >60)
GFR, Estimated: >60 mL/min (ref >60)
GFR, Estimated: >60 mL/min (ref >60)
Glucose, Bld: 461 mg/dL — ABNORMAL HIGH (ref 70–99)
Glucose, Bld: 227 mg/dL — ABNORMAL HIGH (ref 70–99)
Glucose, Bld: 200 mg/dL — ABNORMAL HIGH (ref 70–99)
Glucose, Bld: 138 mg/dL — ABNORMAL HIGH (ref 70–99)
Glucose, Bld: 187 mg/dL — ABNORMAL HIGH (ref 70–99)
Potassium: 3.6 mmol/L (ref 3.5–5.1)
Potassium: 3.6 mmol/L (ref 3.5–5.1)
Potassium: 3.6 mmol/L (ref 3.5–5.1)
Potassium: 5 mmol/L (ref 3.5–5.1)
Potassium: 4.2 mmol/L (ref 3.5–5.1)
Sodium: 133 mmol/L — ABNORMAL LOW (ref 135–145)
Sodium: 134 mmol/L — ABNORMAL LOW (ref 135–145)
Sodium: 135 mmol/L (ref 135–145)
Sodium: 133 mmol/L — ABNORMAL LOW (ref 135–145)
Sodium: 135 mmol/L (ref 135–145)

## 2022-06-16 LAB — GLUCOSE, CAPILLARY
Glucose-Capillary: 195 mg/dL — ABNORMAL HIGH (ref 70–99)
Glucose-Capillary: 181 mg/dL — ABNORMAL HIGH (ref 70–99)
Glucose-Capillary: 150 mg/dL — ABNORMAL HIGH (ref 70–99)
Glucose-Capillary: 131 mg/dL — ABNORMAL HIGH (ref 70–99)
Glucose-Capillary: 141 mg/dL — ABNORMAL HIGH (ref 70–99)
Glucose-Capillary: 161 mg/dL — ABNORMAL HIGH (ref 70–99)
Glucose-Capillary: 163 mg/dL — ABNORMAL HIGH (ref 70–99)
Glucose-Capillary: 198 mg/dL — ABNORMAL HIGH (ref 70–99)
Glucose-Capillary: 183 mg/dL — ABNORMAL HIGH (ref 70–99)
Glucose-Capillary: 133 mg/dL — ABNORMAL HIGH (ref 70–99)
Glucose-Capillary: 182 mg/dL — ABNORMAL HIGH (ref 70–99)

## 2022-06-16 LAB — CBC
HCT: 32.3 % — ABNORMAL LOW (ref 36.0–46.0)
Hemoglobin: 10 g/dL — ABNORMAL LOW (ref 12.0–15.0)
MCH: 25.1 pg — ABNORMAL LOW (ref 26.0–34.0)
MCHC: 31 g/dL (ref 30.0–36.0)
MCV: 81.2 fL (ref 80.0–100.0)
Platelets: 364 10*3/uL (ref 150–400)
RBC: 3.98 MIL/uL (ref 3.87–5.11)
RDW: 14.6 % (ref 11.5–15.5)
WBC: 6.7 10*3/uL (ref 4.0–10.5)
nRBC: 0 % (ref 0.0–0.2)

## 2022-06-16 LAB — MRSA NEXT GEN BY PCR, NASAL: MRSA by PCR Next Gen: NOT DETECTED

## 2022-06-16 LAB — BLOOD GAS, VENOUS
Acid-base deficit: 13.1 mmol/L — ABNORMAL HIGH (ref 0.0–2.0)
Bicarbonate: 14.1 mmol/L — ABNORMAL LOW (ref 20.0–28.0)
O2 Saturation: 48.6 %
Patient temperature: 37
pCO2, Ven: 36 mmHg — ABNORMAL LOW (ref 44–60)
pH, Ven: 7.2 — ABNORMAL LOW (ref 7.25–7.43)
pO2, Ven: <31 mmHg — CL (ref 32–45)

## 2022-06-16 LAB — CBG MONITORING, ED
Glucose-Capillary: 214 mg/dL — ABNORMAL HIGH (ref 70–99)
Glucose-Capillary: 228 mg/dL — ABNORMAL HIGH (ref 70–99)
Glucose-Capillary: 254 mg/dL — ABNORMAL HIGH (ref 70–99)
Glucose-Capillary: 330 mg/dL — ABNORMAL HIGH (ref 70–99)
Glucose-Capillary: 437 mg/dL — ABNORMAL HIGH (ref 70–99)
Glucose-Capillary: 517 mg/dL (ref 70–99)
Glucose-Capillary: 592 mg/dL (ref 70–99)
Glucose-Capillary: >600 mg/dL (ref 70–99)
Glucose-Capillary: >600 mg/dL (ref 70–99)

## 2022-06-16 LAB — BETA-HYDROXYBUTYRIC ACID
Beta-Hydroxybutyric Acid: >8 mmol/L — ABNORMAL HIGH (ref 0.05–0.27)
Beta-Hydroxybutyric Acid: 7.27 mmol/L — ABNORMAL HIGH (ref 0.05–0.27)
Beta-Hydroxybutyric Acid: 0.16 mmol/L (ref 0.05–0.27)

## 2022-06-16 LAB — HEMOGLOBIN A1C
Hgb A1c MFr Bld: 13.9 % — ABNORMAL HIGH (ref 4.8–5.6)
Mean Plasma Glucose: 352.23 mg/dL

## 2022-06-16 MED ORDER — ONDANSETRON HCL 4 MG/2ML IJ SOLN
4.0000 mg | Freq: Four times a day (QID) | INTRAMUSCULAR | Status: DC | PRN
  Administered 2022-06-16: 4 mg via INTRAVENOUS
  Filled 2022-06-16: qty 2

## 2022-06-16 MED ORDER — ACETAMINOPHEN 325 MG PO TABS: 650.0000 mg | ORAL_TABLET | Freq: Four times a day (QID) | ORAL | Status: DC | PRN

## 2022-06-16 MED ORDER — ENOXAPARIN SODIUM 40 MG/0.4ML IJ SOSY
40.0000 mg | PREFILLED_SYRINGE | INTRAMUSCULAR | Status: DC
  Administered 2022-06-16 – 2022-06-17 (×2): 40 mg via SUBCUTANEOUS
  Filled 2022-06-16 (×2): qty 0.4

## 2022-06-16 MED ORDER — INSULIN ASPART 100 UNIT/ML IJ SOLN
0.0000 [IU] | Freq: Three times a day (TID) | INTRAMUSCULAR | Status: DC
  Administered 2022-06-16: 2 [IU] via SUBCUTANEOUS
  Administered 2022-06-16: 3 [IU] via SUBCUTANEOUS

## 2022-06-16 MED ORDER — INSULIN ASPART 100 UNIT/ML IJ SOLN: 4.0000 [IU] | Freq: Three times a day (TID) | INTRAMUSCULAR | Status: DC

## 2022-06-16 MED ORDER — ORAL CARE MOUTH RINSE: 15.0000 mL | OROMUCOSAL | Status: DC | PRN

## 2022-06-16 MED ORDER — INSULIN DETEMIR 100 UNIT/ML ~~LOC~~ SOLN
10.0000 [IU] | Freq: Two times a day (BID) | SUBCUTANEOUS | Status: DC
  Administered 2022-06-16 – 2022-06-17 (×3): 10 [IU] via SUBCUTANEOUS
  Filled 2022-06-16 (×3): qty 0.1

## 2022-06-16 MED ORDER — CHLORHEXIDINE GLUCONATE CLOTH 2 % EX PADS
6.0000 | MEDICATED_PAD | Freq: Every day | CUTANEOUS | Status: DC
  Administered 2022-06-16: 6 via TOPICAL

## 2022-06-16 MED ORDER — POTASSIUM CHLORIDE CRYS ER 20 MEQ PO TBCR
40.0000 meq | EXTENDED_RELEASE_TABLET | Freq: Two times a day (BID) | ORAL | Status: DC
  Administered 2022-06-16 – 2022-06-17 (×2): 40 meq via ORAL
  Filled 2022-06-16 (×2): qty 2

## 2022-06-16 MED ORDER — INSULIN ASPART 100 UNIT/ML IJ SOLN: 0.0000 [IU] | Freq: Every day | INTRAMUSCULAR | Status: DC

## 2022-06-16 NOTE — ED Notes (Signed)
ED TO INPATIENT HANDOFF REPORT  ED Nurse Name and Phone #: Dorene Sorrow Name/Age/Gender Hilbert Odor 27 y.o. female Room/Bed: WA17/WA17  Code Status   Code Status: Full Code  Home/SNF/Other Home Patient oriented to: self, place, time, and situation Is this baseline? Yes   Triage Complete: Triage complete  Chief Complaint Diabetic ketoacidosis without coma associated with type 1 diabetes mellitus (HCC) [E10.10]  Triage Note Pt arrived POV for abd pain and increased frequency in urination. Pt reports increased thirst, has went through "2 jugs of juices". Reports sugar reading "HIGH" at home. Pt was seen at [facility] in [facility], advised to come to ED for further evaluation.  +Ketones in urine at UC, and urine glucose >1000. VSS, NAD noted, A&O x4.   CBG "HIGH" in triage. Pt has type I diabetes.    Allergies No Known Allergies  Level of Care/Admitting Diagnosis ED Disposition     ED Disposition  Admit   Condition  --   Comment  Hospital Area: [facility] [[id number]]  Level of Care: Stepdown [14]  Admit to SDU based on following criteria: Severe physiological/psychological symptoms:  Any diagnosis requiring assessment & intervention at least every 4 hours on an ongoing basis to obtain desired patient outcomes including stability and rehabilitation  May admit patient to [facility] or [facility] if equivalent level of care is available:: No  Covid Evaluation: Asymptomatic - no recent exposure (last 10 days) testing not required  Diagnosis: Diabetic ketoacidosis without coma associated with type 1 diabetes mellitus [facility]) [[id number]]  Admitting Physician: Charlsie Quest [[id number]]  Attending Physician: Charlsie Quest [[id number]]  Certification:: I certify this patient will need inpatient services for at least 2 midnights  Estimated Length of Stay: 2          B Medical/Surgery History Past Medical History:  Diagnosis Date   Allergy     dust    Anxiety    Asthma    Diabetes mellitus    Insulin Dependant   Learning disability    Lupus (HCC)    Urinary tract infection    Vision abnormalities    wears glasses   Past Surgical History:  Procedure Laterality Date   NO PAST SURGERIES       A IV Location/Drains/Wounds Patient Lines/Drains/Airways Status     Active Line/Drains/Airways     Name Placement date Placement time Site Days   Peripheral IV 06/15/22 20 G Anterior;Distal;Right;Upper Arm 06/15/22  2330  Arm  1   Peripheral IV 06/16/22 18 G 2.5" Left;Upper Arm 06/16/22  0031  Arm  less than 1            Intake/Output Last 24 hours  Intake/Output Summary (Last 24 hours) at 06/16/2022 0745 Last data filed at 06/16/2022 0545 Gross per 24 hour  Intake 2520.85 ml  Output --  Net 2520.85 ml    Labs/Imaging Results for orders placed or performed during the hospital encounter of 06/15/22 (from the past 48 hour(s))  CBG monitoring, ED     Status: Abnormal   Collection Time: 06/15/22 10:02 PM  Result Value Ref Range   Glucose-Capillary >600 (HH) 70 - 99 mg/dL    Comment: Glucose reference range applies only to samples taken after fasting for at least 8 hours.   Comment 1 Notify RN    Comment 2 Document in Chart   Urinalysis, Routine w reflex microscopic -Urine, Clean Catch     Status: Abnormal  Collection Time: 06/15/22 10:05 PM  Result Value Ref Range   Color, Urine COLORLESS (A) YELLOW   APPearance CLEAR CLEAR   Specific Gravity, Urine 1.021 1.005 - 1.030   pH 5.0 5.0 - 8.0   Glucose, UA >=500 (A) NEGATIVE mg/dL   Hgb urine dipstick NEGATIVE NEGATIVE   Bilirubin Urine NEGATIVE NEGATIVE   Ketones, ur 20 (A) NEGATIVE mg/dL   Protein, ur NEGATIVE NEGATIVE mg/dL   Nitrite NEGATIVE NEGATIVE   Leukocytes,Ua NEGATIVE NEGATIVE   RBC / HPF 0-5 0 - 5 RBC/hpf   WBC, UA 0-5 0 - 5 WBC/hpf   Bacteria, UA RARE (A) NONE SEEN   Squamous Epithelial / HPF 0-5 0 - 5 /HPF   Mucus PRESENT     Comment: Performed at  [facility], [address] [place], [place] [address]  CBC with Differential     Status: Abnormal   Collection Time: 06/15/22 10:55 PM  Result Value Ref Range   WBC 5.8 4.0 - 10.5 K/uL   RBC 3.89 3.87 - 5.11 MIL/uL   Hemoglobin 9.8 (L) 12.0 - 15.0 g/dL   HCT 60.4 (L) 54.0 - 98.1 %   MCV 85.6 80.0 - 100.0 fL   MCH 25.2 (L) 26.0 - 34.0 pg   MCHC 29.4 (L) 30.0 - 36.0 g/dL   RDW 19.1 47.8 - 29.5 %   Platelets 333 150 - 400 K/uL   nRBC 0.0 0.0 - 0.2 %   Neutrophils Relative % 53 %   Neutro Abs 3.1 1.7 - 7.7 K/uL   Lymphocytes Relative 38 %   Lymphs Abs 2.2 0.7 - 4.0 K/uL   Monocytes Relative 8 %   Monocytes Absolute 0.5 0.1 - 1.0 K/uL   Eosinophils Relative 1 %   Eosinophils Absolute 0.1 0.0 - 0.5 K/uL   Basophils Relative 0 %   Basophils Absolute 0.0 0.0 - 0.1 K/uL   Immature Granulocytes 0 %   Abs Immature Granulocytes 0.01 0.00 - 0.07 K/uL    Comment: Performed at [facility], [address] [place], [place] [address]  Basic metabolic panel     Status: Abnormal   Collection Time: 06/15/22 10:55 PM  Result Value Ref Range   Sodium 125 (L) 135 - 145 mmol/L   Potassium 5.0 3.5 - 5.1 mmol/L   Chloride 92 (L) 98 - 111 mmol/L   CO2 10 (L) 22 - 32 mmol/L   Glucose, Bld 894 (HH) 70 - 99 mg/dL    Comment: CRITICAL RESULT CALLED TO, READ BACK BY AND VERIFIED WITH YATES, J RN @2339  06/15/22 BY CHILDRESS,E Glucose reference range applies only to samples taken after fasting for at least 8 hours.    BUN 19 6 - 20 mg/dL   Creatinine, Ser 8.65 (H) 0.44 - 1.00 mg/dL   Calcium 8.3 (L) 8.9 - 10.3 mg/dL   GFR, Estimated 55 (L) >60 mL/min    Comment: (NOTE) Calculated using the CKD-EPI Creatinine Equation (2021)    Anion gap 23 (H) 5 - 15    Comment: ELECTROLYTES REPEATED TO VERIFY Performed at [facility], [address] [place], [place] [address]   Beta-hydroxybutyric acid     Status: Abnormal   Collection Time: 06/15/22  10:55 PM  Result Value Ref Range   Beta-Hydroxybutyric Acid >8.00 (H) 0.05 - 0.27 mmol/L    Comment: RESULT CONFIRMED BY MANUAL DILUTION Performed at [facility], [address] [place], [place] [address]   hCG,  quantitative, pregnancy     Status: None   Collection Time: 06/15/22 10:55 PM  Result Value Ref Range   hCG, Beta Chain, Quant, S 1 <5 mIU/mL    Comment:          GEST. AGE      CONC.  (mIU/mL)   <=1 WEEK        5 - 50     2 WEEKS       50 - 500     3 WEEKS       100 - 10,000     4 WEEKS     1,000 - 30,000     5 WEEKS     3,500 - 115,000   6-8 WEEKS     12,000 - 270,000    12 WEEKS     15,000 - 220,000        FEMALE AND NON-PREGNANT FEMALE:     LESS THAN 5 mIU/mL Performed at [facility], [address] [place], [place] [address]   I-stat chem 8, ED (not at [facility], [facility] or [facility])     Status: Abnormal   Collection Time: 06/15/22 11:13 PM  Result Value Ref Range   Sodium 126 (L) 135 - 145 mmol/L   Potassium 5.2 (H) 3.5 - 5.1 mmol/L   Chloride 96 (L) 98 - 111 mmol/L   BUN 18 6 - 20 mg/dL   Creatinine, Ser 6.04 0.44 - 1.00 mg/dL   Glucose, Bld >540 (HH) 70 - 99 mg/dL    Comment: Glucose reference range applies only to samples taken after fasting for at least 8 hours.   Calcium, Ion 1.16 1.15 - 1.40 mmol/L   TCO2 14 (L) 22 - 32 mmol/L   Hemoglobin 12.6 12.0 - 15.0 g/dL   HCT 98.1 19.1 - 47.8 %   Comment NOTIFIED PHYSICIAN   Blood gas, venous     Status: Abnormal   Collection Time: 06/15/22 11:20 PM  Result Value Ref Range   pH, Ven 7.2 (L) 7.25 - 7.43   pCO2, Ven 36 (L) 44 - 60 mmHg   pO2, Ven <31 (LL) 32 - 45 mmHg    Comment: CRITICAL RESULT CALLED TO, READ BACK BY AND VERIFIED WITH: YATES,J RN @2345  06/15/22 BY CHILDRESS,E    Bicarbonate 14.1 (L) 20.0 - 28.0 mmol/L   Acid-base deficit 13.1 (H) 0.0 - 2.0 mmol/L   O2 Saturation 48.6 %   Patient temperature 37.0     Comment: Performed at [facility], [address] [place], [place] [address]  CBG monitoring, ED     Status: Abnormal   Collection Time: 06/16/22 12:42 AM  Result Value Ref Range   Glucose-Capillary >600 (HH) 70 - 99 mg/dL    Comment: Glucose reference range applies only to samples taken after fasting for at least 8 hours.  CBG monitoring, ED     Status: Abnormal   Collection Time: 06/16/22  1:17 AM  Result Value Ref Range   Glucose-Capillary >600 (HH) 70 - 99 mg/dL    Comment: Glucose reference range applies only to samples taken after fasting for at least 8 hours.   Comment 1 Document in Chart   CBG monitoring, ED     Status: Abnormal   Collection Time: 06/16/22  1:50 AM  Result Value Ref Range   Glucose-Capillary 592 (HH) 70 - 99 mg/dL    Comment: Glucose reference range applies only to samples taken after fasting for at least  8 hours.   Comment 1 Document in Chart   CBG monitoring, ED     Status: Abnormal   Collection Time: 06/16/22  2:22 AM  Result Value Ref Range   Glucose-Capillary 517 (HH) 70 - 99 mg/dL    Comment: Glucose reference range applies only to samples taken after fasting for at least 8 hours.   Comment 1 Document in Chart   CBG monitoring, ED     Status: Abnormal   Collection Time: 06/16/22  2:55 AM  Result Value Ref Range   Glucose-Capillary 437 (H) 70 - 99 mg/dL    Comment: Glucose reference range applies only to samples taken after fasting for at least 8 hours.   Comment 1 Notify RN   Basic metabolic panel     Status: Abnormal   Collection Time: 06/16/22  2:58 AM  Result Value Ref Range   Sodium 133 (L) 135 - 145 mmol/L   Potassium 3.6 3.5 - 5.1 mmol/L   Chloride 100 98 - 111 mmol/L   CO2 13 (L) 22 - 32 mmol/L   Glucose, Bld 461 (H) 70 - 99 mg/dL   BUN 17 6 - 20 mg/dL   Creatinine, Ser 1.61 (H) 0.44 - 1.00 mg/dL   Calcium 8.6 (L) 8.9 - 10.3 mg/dL   GFR, Estimated TEST NOT PERFORMED >60 mL/min   GFR calc Af Amer TEST NOT PERFORMED >60 mL/min   Anion gap 20.0 (H) 5 - 15    Comment: Performed  at [facility], [address] [place], [place] [address]  Beta-hydroxybutyric acid     Status: Abnormal   Collection Time: 06/16/22  2:58 AM  Result Value Ref Range   Beta-Hydroxybutyric Acid 7.27 (H) 0.05 - 0.27 mmol/L    Comment: RESULTS CONFIRMED BY MANUAL DILUTION Performed at [facility], [address], [place], [place] [address]   CBG monitoring, ED     Status: Abnormal   Collection Time: 06/16/22  3:30 AM  Result Value Ref Range   Glucose-Capillary 330 (H) 70 - 99 mg/dL    Comment: Glucose reference range applies only to samples taken after fasting for at least 8 hours.   Comment 1 Document in Chart   CBG monitoring, ED     Status: Abnormal   Collection Time: 06/16/22  4:38 AM  Result Value Ref Range   Glucose-Capillary 254 (H) 70 - 99 mg/dL    Comment: Glucose reference range applies only to samples taken after fasting for at least 8 hours.  CBG monitoring, ED     Status: Abnormal   Collection Time: 06/16/22  5:42 AM  Result Value Ref Range   Glucose-Capillary 214 (H) 70 - 99 mg/dL    Comment: Glucose reference range applies only to samples taken after fasting for at least 8 hours.   Comment 1 Document in Chart   CBC     Status: Abnormal   Collection Time: 06/16/22  6:07 AM  Result Value Ref Range   WBC 6.7 4.0 - 10.5 K/uL   RBC 3.98 3.87 - 5.11 MIL/uL   Hemoglobin 10.0 (L) 12.0 - 15.0 g/dL   HCT 11.9 (L) 14.7 - 82.9 %   MCV 81.2 80.0 - 100.0 fL   MCH 25.1 (L) 26.0 - 34.0 pg   MCHC 31.0 30.0 - 36.0 g/dL   RDW 56.2 13.0 - 86.5 %   Platelets 364 150 - 400 K/uL   nRBC 0.0 0.0 - 0.2 %    Comment:  Performed at [facility], [address] [place], [place] [address]  CBG monitoring, ED     Status: Abnormal   Collection Time: 06/16/22  6:46 AM  Result Value Ref Range   Glucose-Capillary 228 (H) 70 - 99 mg/dL    Comment: Glucose reference range applies only to samples taken after fasting for at least 8 hours.    Comment 1 Document in Chart   Basic metabolic panel     Status: Abnormal   Collection Time: 06/16/22  6:48 AM  Result Value Ref Range   Sodium 134 (L) 135 - 145 mmol/L    Comment: DELTA CHECK NOTED   Potassium 3.6 3.5 - 5.1 mmol/L   Chloride 101 98 - 111 mmol/L   CO2 21 (L) 22 - 32 mmol/L   Glucose, Bld 227 (H) 70 - 99 mg/dL    Comment: Glucose reference range applies only to samples taken after fasting for at least 8 hours.   BUN 13 6 - 20 mg/dL   Creatinine, Ser 6.04 0.44 - 1.00 mg/dL   Calcium 8.6 (L) 8.9 - 10.3 mg/dL   GFR, Estimated >54 >09 mL/min    Comment: (NOTE) Calculated using the CKD-EPI Creatinine Equation (2021)    Anion gap 12 5 - 15    Comment: Performed at [facility], [address], [place], [place] [address]   No results found.  Pending Labs Unresulted Labs (From admission, onward)     Start     Ordered   06/16/22 1100  Basic metabolic panel  (Diabetes Ketoacidosis (DKA))  STAT Now then every 4 hours ,   R      06/16/22 0655   06/16/22 0500  Hemoglobin A1c  (Diabetes Ketoacidosis (DKA))  Tomorrow morning,   R       Comments: To assess prior glycemic control.    06/16/22 0013            Vitals/Pain Today's Vitals   06/16/22 0445 06/16/22 0545 06/16/22 0600 06/16/22 0630  BP:  130/81 126/84 93/61  Pulse:  (!) 101 95 95  Resp:  16  17  Temp:  99 F (37.2 C)    TempSrc:      SpO2: 97% 99% 96% 97%  Weight:      Height:      PainSc:        Isolation Precautions No active isolations  Medications Medications  insulin regular, human (MYXREDLIN) 100 units/ 100 mL infusion (3.4 Units/hr Intravenous Rate/Dose Change 06/16/22 0647)  lactated ringers infusion (0 mLs Intravenous Stopped 06/16/22 0545)  dextrose 5 % in lactated ringers infusion ( Intravenous New Bag/Given 06/16/22 0547)  dextrose 50 % solution 0-50 mL (has no administration in time range)  enoxaparin (LOVENOX) injection 40 mg (has no administration in time range)   acetaminophen (TYLENOL) tablet 650 mg (has no administration in time range)  ondansetron (ZOFRAN) injection 4 mg (4 mg Intravenous Given 06/16/22 0554)  lactated ringers bolus 2,000 mL (0 mLs Intravenous Stopped 06/16/22 0049)    Mobility walks     Focused Assessments    R Recommendations: See Admitting Provider Note  Report given to:   Additional Notes:

## 2022-06-16 NOTE — Hospital Course (Signed)
[name] is a 27 y.o. female with medical history significant for type 1 diabetes using insulin pump, diabetic neuropathy, HLD, GERD who is admitted with DKA.

## 2022-06-16 NOTE — Progress Notes (Addendum)
[facility] PROGRESS NOTE    Progress Note  [name]  ZOX:[id number] DOB: [birth year] DOA: 06/15/2022 PCP: Default, Provider, MD     Brief Narrative:   [name] is an 27 y.o. female past medical history of diabetes mellitus type 1 on insulin pump diabetic neuropathy comes into the ED with hyperglycemia was found in DKA blood glucose of 800 bicarb 10 anion gap of 23 .   Assessment/Plan:   Diabetic ketoacidosis without coma associated with type 1 diabetes mellitus (HCC) Insulin pump was discontinued she was started on IV insulin IV fluids. Blood glucose this morning is 214 still on insulin drip. Bicarb 13, continue Bumex every 4 hours.  Acute kidney injury: Likely prerenal versus ischemia started on IV fluids creatinine is improving nicely. Baseline creatinine 0.5 on admission 1.2.  Pseudohyponatremia: In the setting of hyperglycemia sodium is improving slowly.  Normocytic anemia: Hemoglobin stable continue to monitor.  Gastroparesis: Healthy diabetic continue Zofran as needed.     DVT prophylaxis: lovenox Family Communication:none Status is: Inpatient Remains inpatient appropriate because: DKA    Code Status:     Code Status Orders  (From admission, onward)           Start     Ordered   06/16/22 0009  Full code  Continuous       Question:  By:  Answer:  Consent: discussion documented in EHR   06/16/22 0013           Code Status History     Date Active Date Inactive Code Status Order ID Comments User Context   07/26/2021 1645 07/29/2021 1742 Full Code [id number]  Orland Mustard, MD ED   05/01/2017 2020 05/03/2017 1628 Full Code [id number]  Briscoe Deutscher, MD ED   07/28/2013 2304 07/30/2013 1857 Full Code [id number]  Osvaldo Shipper, MD ED   08/12/2012 2027 08/16/2012 1550 Full Code [id number]  Lonia Farber, MD Inpatient   04/04/2012 1719 04/05/2012 2206 Full Code [id number]  Rama, Maryruth Bun, MD Inpatient   02/07/2011 1335 02/18/2011 1635  Full Code [id number]  Antoine Poche, RN Inpatient         IV Access:   Peripheral IV   Procedures and diagnostic studies:   No results found.   Medical Consultants:   None.   Subjective:    [name] relates still nauseated but would like to eat.  Objective:    Vitals:   06/16/22 0400 06/16/22 0415 06/16/22 0445 06/16/22 0545  BP:    130/81  Pulse:    (!) 101  Resp:    16  Temp:    99 F (37.2 C)  TempSrc:      SpO2: 98% 98% 97% 99%  Weight:      Height:       SpO2: 99 %   Intake/Output Summary (Last 24 hours) at 06/16/2022 0647 Last data filed at 06/16/2022 0545 Gross per 24 hour  Intake 2520.85 ml  Output --  Net 2520.85 ml   Filed Weights   06/15/22 2204  Weight: 75.8 kg    Exam: General exam: In no acute distress. Respiratory system: Good air movement and clear to auscultation. Cardiovascular system: S1 & S2 heard, RRR. No JVD.  Gastrointestinal system: Abdomen is nondistended, soft and nontender.  Extremities: No pedal edema. Skin: No rashes, lesions or ulcers Psychiatry: Judgement and insight appear normal. Mood & affect appropriate.    Data Reviewed:    Labs: Basic Metabolic Panel: Recent Labs  Lab 06/15/22 2255  06/15/22 2313 06/16/22 0258  NA 125* 126* 133*  K 5.0 5.2* 3.6  CL 92* 96* 100  CO2 10*  --  13*  GLUCOSE 894* >700* 461*  BUN 19 18 17   CREATININE 1.35* 0.80 1.12*  CALCIUM 8.3*  --  8.6*   GFR Estimated Creatinine Clearance: 74.5 mL/min (A) (by C-G formula based on SCr of 1.12 mg/dL (H)). Liver Function Tests: No results for input(s): "AST", "ALT", "ALKPHOS", "BILITOT", "PROT", "ALBUMIN" in the last 168 hours. No results for input(s): "LIPASE", "AMYLASE" in the last 168 hours. No results for input(s): "AMMONIA" in the last 168 hours. Coagulation profile No results for input(s): "INR", "PROTIME" in the last 168 hours. COVID-19 Labs  No results for input(s): "DDIMER", "FERRITIN", "LDH", "CRP" in the  last 72 hours.  No results found for: "SARSCOV2NAA"  CBC: Recent Labs  Lab 06/15/22 2255 06/15/22 2313 06/16/22 0607  WBC 5.8  --  6.7  NEUTROABS 3.1  --   --   HGB 9.8* 12.6 10.0*  HCT 33.3* 37.0 32.3*  MCV 85.6  --  81.2  PLT 333  --  364   Cardiac Enzymes: No results for input(s): "CKTOTAL", "CKMB", "CKMBINDEX", "TROPONINI" in the last 168 hours. BNP (last 3 results) No results for input(s): "PROBNP" in the last 8760 hours. CBG: Recent Labs  Lab 06/16/22 0222 06/16/22 0255 06/16/22 0330 06/16/22 0438 06/16/22 0542  GLUCAP 517* 437* 330* 254* 214*   D-Dimer: No results for input(s): "DDIMER" in the last 72 hours. Hgb A1c: No results for input(s): "HGBA1C" in the last 72 hours. Lipid Profile: No results for input(s): "CHOL", "HDL", "LDLCALC", "TRIG", "CHOLHDL", "LDLDIRECT" in the last 72 hours. Thyroid function studies: No results for input(s): "TSH", "T4TOTAL", "T3FREE", "THYROIDAB" in the last 72 hours.  Invalid input(s): "FREET3" Anemia work up: No results for input(s): "VITAMINB12", "FOLATE", "FERRITIN", "TIBC", "IRON", "RETICCTPCT" in the last 72 hours. Sepsis Labs: Recent Labs  Lab 06/15/22 2255 06/16/22 0607  WBC 5.8 6.7   Microbiology No results found for this or any previous visit (from the past 240 hour(s)).   Medications:    enoxaparin (LOVENOX) injection  40 mg Subcutaneous Q24H   Continuous Infusions:  dextrose 5% lactated ringers 125 mL/hr at 06/16/22 0547   insulin 2.4 Units/hr (06/16/22 0544)   lactated ringers Stopped (06/16/22 0545)      LOS: 1 day   Marinda Elk  [facility]  06/16/2022, 6:47 AM

## 2022-06-16 NOTE — Progress Notes (Signed)
Initial Nutrition Assessment  DOCUMENTATION CODES:   Not applicable  INTERVENTION:  - Clear Liquid diet per MD. Advance as tolerated.   - Diet education with handout provided for diabetes nutrition therapy.   - Patient interested in getting established with outpatient dietitian. Will order referral.    NUTRITION DIAGNOSIS:   Food and nutrition related knowledge deficit related to limited prior education as evidenced by per patient/family report.  GOAL:   Patient will meet greater than or equal to 90% of their needs  MONITOR:   PO intake, Diet advancement, Weight trends, Labs  REASON FOR ASSESSMENT:   Consult Assessment of nutrition requirement/status, Diet education  ASSESSMENT:   27 y.o. female with PMH for type 1 diabetes using insulin pump, diabetic neuropathy, HLD, GERD who presented to the ED for evaluation of hyperglycemia. Admitted for DKA.    Patient reports her UBW fluctuates between 160-170#. Denied any significant changes in weight outside of normal fluctuations.   Endorses that her eating habits vary day to day. States she has a daughter with special needs and sometimes she will have her day planned out and it will simply no go to plan and change a lot. Usually ends up having at least 1 meal a day.   RD provided "Carbohydrate Counting for People with Diabetes" handout from the Academy of [employer] and Dietetics. Discussed different food groups and their effects on blood sugar, emphasizing carbohydrate-containing foods. Provided list of carbohydrates and recommended serving sizes of common foods. Discussed importance of controlled and consistent carbohydrate intake throughout the day. Provided examples of ways to balance meals/snacks and encouraged intake of high-fiber, whole grain complex carbohydrates. Teach back method used.  Patient reports she counts carbs at home but her problem is insulin administration. Notes she usually waits til after eating to provide  insulin and sometimes will forget. She is interested in getting established with an outpatient RD for assistance. Will order.  Patient advanced to clear liquids today but had not eaten anything at time of visit this afternoon. Agreeable to receive a lunch tray as she is feeling somewhat hungry. Upon leaving room lunch tray arrived for patient.   Medications reviewed and include: D5 @ 157mL/hr (provides 510 kcal over 24 hours)  Labs reviewed:   Na 134 HA1C 14.9 Blood Glucose 181 - >600   NUTRITION - FOCUSED PHYSICAL EXAM:  No depletions noted  Diet Order:   Diet Order             Diet clear liquid Room service appropriate? Yes; Fluid consistency: Thin  Diet effective now                   EDUCATION NEEDS:  Education needs have been addressed  Skin:  Skin Assessment: Reviewed RN Assessment  Last BM:  5/1  Height:  Ht Readings from Last 1 Encounters:  06/15/22 5\' 4"  (1.626 m)   Weight:  Wt Readings from Last 1 Encounters:  06/15/22 75.8 kg    BMI:  Body mass index is 28.67 kg/m.  Estimated Nutritional Needs:  Kcal:  1900-2100 kcals Protein:  60-75 grams Fluid:  >/= 1.9L    Shelle Iron RD, LDN For contact information, refer to [facility].

## 2022-06-16 NOTE — Assessment & Plan Note (Signed)
Serum glucose 894 with bicarb 10, anion gap 23, pH 7.2 on VBG.  She is mentating well.  No obvious inciting factor, question dietary and/or medication adherence. -Continue insulin infusion per protocol -Continue IV fluids as ordered -Follow serial BMET -Keep n.p.o.

## 2022-06-16 NOTE — Inpatient Diabetes Management (Signed)
Inpatient Diabetes Program Recommendations  AACE/ADA: New Consensus Statement on Inpatient Glycemic Control (2015)  Target Ranges:  Prepandial:   less than 140 mg/dL      Peak postprandial:   less than 180 mg/dL (1-2 hours)      Critically ill patients:  140 - 180 mg/dL   Lab Results  Component Value Date   GLUCAP 198 (H) 06/16/2022   HGBA1C 13.9 (H) 06/16/2022    Review of Glycemic Control  Diabetes history: DM1 Outpatient Diabetes medications: Insulin pump Current orders for Inpatient glycemic control: IV insulin transitioning to Levemir 10 units BID, Novolog 0-15 TID with meals and 0-5 HS + 4 units TID  HgbA1C - 13.9% Pump settings: Basal - 20 units Q24H Bolus - 1:8 CHO + correction  Inpatient Diabetes Program Recommendations:    Agree with orders at present.  Spoke with pt at bedside regarding her diabetes and HgbA1C of 13.9% (average blood sugar 352 mg/dL.) Pt states she uses insulin pump, although forgets to bolus for meals d/t demands of having special needs child and stress. States she went into DKA because she didn't put pump back on after changing site and then went to run errands. Last appt with Endo was 09/23/21. States she has appt scheduled for 5/23 with Dr Shawnee Knapp. Pt has Dexcom. Discussed importance of checking CBGs and maintaining good CBG control to prevent long-term and short-term complications. Explained how hyperglycemia leads to damage within blood vessels which lead to the common complications seen with uncontrolled diabetes. Stressed to the patient the importance of improving glycemic control to prevent further complications from uncontrolled diabetes. Discussed impact of nutrition, exercise, stress, sickness, and medications on diabetes control. Pt had no questons/concerns. Verbalized understanding of information.  Continue to follow.   Thank you. Ailene Ards, RD, LDN, CDCES Inpatient Diabetes Coordinator [phone]

## 2022-06-16 NOTE — Assessment & Plan Note (Signed)
Mild in setting of hypovolemia.  Continue IV fluids and follow repeat labs.

## 2022-06-16 NOTE — Assessment & Plan Note (Signed)
In setting of hyperglycemia.  Corrected sodium 144.

## 2022-06-16 NOTE — Progress Notes (Signed)
  Transition of Care [facility]) Screening Note   Patient Details  Name: Chaslyn Eisen Date of Birth: 08/05/1995   Transition of Care [facility]) CM/SW Contact:    Lanier Clam, RN Phone Number: 06/16/2022, 11:19 AM    Transition of Care Department [facility]) has reviewed patient and no TOC needs have been identified at this time. We will continue to monitor patient advancement through interdisciplinary progression rounds. If new patient transition needs arise, please place a TOC consult.

## 2022-06-16 NOTE — Assessment & Plan Note (Signed)
Hemoglobin stable.  Continue to monitor. 

## 2022-06-16 NOTE — TOC Progression Note (Signed)
Transition of Care [facility]) - Progression Note    Patient Details  Name: [name] MRN: [id number] Date of Birth: 09-02-1995  Transition of Care [facility]) CM/SW Contact  Tavion Senkbeil, Olegario Messier, RN Phone Number: 06/16/2022, 11:19 AM  Clinical Narrative:  Noted referral for med asst-patient has health insurance-has pcp,pharmacy,obligated co pay for meds. Continue to monitor for d/c plans.          Expected Discharge Plan and Services                                               Social Determinants of Health (SDOH) Interventions SDOH Screenings   Tobacco Use: High Risk (06/15/2022)    Readmission Risk Interventions     No data to display

## 2022-06-17 DIAGNOSIS — E1010 Type 1 diabetes mellitus with ketoacidosis without coma: Secondary | ICD-10-CM | POA: Diagnosis not present

## 2022-06-17 DIAGNOSIS — E109 Type 1 diabetes mellitus without complications: Secondary | ICD-10-CM | POA: Diagnosis not present

## 2022-06-17 LAB — GLUCOSE, CAPILLARY: Glucose-Capillary: 78 mg/dL (ref 70–99)

## 2022-06-17 LAB — BASIC METABOLIC PANEL
Anion gap: 8 (ref 5–15)
BUN: 6 mg/dL (ref 6–20)
CO2: 24 mmol/L (ref 22–32)
Calcium: 8.8 mg/dL — ABNORMAL LOW (ref 8.9–10.3)
Chloride: 105 mmol/L (ref 98–111)
Creatinine, Ser: 0.62 mg/dL (ref 0.44–1.00)
GFR, Estimated: >60 mL/min (ref >60)
Glucose, Bld: 88 mg/dL (ref 70–99)
Potassium: 3.7 mmol/L (ref 3.5–5.1)
Sodium: 137 mmol/L (ref 135–145)

## 2022-06-17 NOTE — Plan of Care (Signed)
Plan of care reviewed and discussed. Problem: Education: Goal: Individualized Educational Video(s) Outcome: Completed/Met   Problem: Coping: Goal: Ability to adjust to condition or change in health will improve Outcome: Progressing   Problem: Fluid Volume: Goal: Ability to maintain a balanced intake and output will improve Outcome: Adequate for Discharge   Problem: Health Behavior/Discharge Planning: Goal: Ability to identify and utilize available resources and services will improve Outcome: Progressing Goal: Ability to manage health-related needs will improve Outcome: Completed/Met   Problem: Metabolic: Goal: Ability to maintain appropriate glucose levels will improve Outcome: Progressing   Problem: Nutritional: Goal: Maintenance of adequate nutrition will improve Outcome: Adequate for Discharge Goal: Progress toward achieving an optimal weight will improve Outcome: Completed/Met   Problem: Skin Integrity: Goal: Risk for impaired skin integrity will decrease Outcome: Completed/Met   Problem: Tissue Perfusion: Goal: Adequacy of tissue perfusion will improve Outcome: Completed/Met   Problem: Cardiac: Goal: Ability to maintain an adequate cardiac output will improve Outcome: Completed/Met

## 2022-06-17 NOTE — Discharge Summary (Signed)
Physician Discharge Summary  [name] WUJ:[id number] DOB: [birth year] DOA: 06/15/2022  PCP: Default, Provider, MD  Admit date: 06/15/2022 Discharge date: 06/17/2022  Admitted From: [facility] Disposition:  Home  Recommendations for Outpatient Follow-up:  Follow up with PCP in 1-2 weeks Please obtain BMP/CBC in one week   Home Health:No Equipment/Devices:None  Discharge Condition:Stable CODE STATUS:Full Diet recommendation: Heart Healthy   Brief/Interim Summary:  27 y.o. female past medical history of diabetes mellitus type 1 on insulin pump diabetic neuropathy comes into the ED with hyperglycemia was found in DKA blood glucose of 800 bicarb 10 anion gap of 23 .   Discharge Diagnoses:  Principal Problem:   Diabetic ketoacidosis without coma associated with type 1 diabetes mellitus (HCC) Active Problems:   AKI (acute kidney injury) (HCC)   Normocytic anemia   Pseudohyponatremia  DKA without coma associated diabetes mellitus type 1: Her insulin pump was discontinued she was started on IV insulin drip once her blood glucose improved she was transition to long-acting insulin per sliding scale blood glucose remained well-controlled is likely due to not compliant with her medication.  Acute kidney injury: Likely prerenal and resolved with IV fluid resuscitation.  Pseudohyponatremia: The setting of hyperglycemia improve once her sugar was corrected.  Normocytic anemia: Her hemoglobin is stable.  Gastroparesis: Tolerating her diet  Discharge Instructions  Discharge Instructions     Amb Referral to Nutrition and Diabetic Education   Complete by: As directed    Diet - low sodium heart healthy   Complete by: As directed    Increase activity slowly   Complete by: As directed       Allergies as of 06/17/2022   No Known Allergies      Medication List     TAKE these medications    BLOOD GLUCOSE METER DISPOSABLE Devi Use as directed   insulin aspart 100 UNIT/ML  FlexPen Commonly known as: NOVOLOG Inject 10 Units into the skin 3 (three) times daily with meals.   insulin glargine 100 UNIT/ML injection Commonly known as: LANTUS Inject 20 Units into the skin daily as needed (high blood sugar).   Insulin Pen Needle 31G X 5 MM Misc Use with insulin pen   Pen Needles 3/16" 31G X 5 MM Misc Use as directed   loratadine 10 MG tablet Commonly known as: CLARITIN Take 1 tablet (10 mg total) by mouth daily.   metoCLOPramide 5 MG tablet Commonly known as: Reglan Take 1 tablet (5 mg total) by mouth 4 (four) times daily -  before meals and at bedtime.   Nexplanon 68 MG Impl implant Generic drug: etonogestrel 1 each by Subdermal route once.   ondansetron 8 MG disintegrating tablet Commonly known as: ZOFRAN-ODT Take 8 mg by mouth every 8 (eight) hours as needed for nausea or vomiting.   pantoprazole 40 MG tablet Commonly known as: Protonix Take 1 tablet (40 mg total) by mouth daily.   pravastatin 40 MG tablet Commonly known as: PRAVACHOL Take 40 mg by mouth every evening.        No Known Allergies  Consultations: None   Procedures/Studies: No results found. (Echo, Carotid, EGD, Colonoscopy, ERCP)    Subjective: No complaints  Discharge Exam: Vitals:   06/17/22 0559 06/17/22 0939  BP: 105/72 119/74  Pulse: 80 79  Resp: 16 16  Temp: 98.2 F (36.8 C) 98.9 F (37.2 C)  SpO2: 100% 100%   Vitals:   06/16/22 2214 06/17/22 0132 06/17/22 0559 06/17/22 0939  BP: 118/81 116/75 105/72 119/74  Pulse: 87 87 80 79  Resp: 16 16 16 16   Temp: 99 F (37.2 C) 98.4 F (36.9 C) 98.2 F (36.8 C) 98.9 F (37.2 C)  TempSrc: Oral Oral Oral Oral  SpO2: 100% 100% 100% 100%  Weight:      Height:        General: Pt is alert, awake, not in acute distress Cardiovascular: RRR, S1/S2 +, no rubs, no gallops Respiratory: CTA bilaterally, no wheezing, no rhonchi Abdominal: Soft, NT, ND, bowel sounds + Extremities: no edema, no  cyanosis    The results of significant diagnostics from this hospitalization (including imaging, microbiology, ancillary and laboratory) are listed below for reference.     Microbiology: Recent Results (from the past 240 hour(s))  MRSA Next Gen by PCR, Nasal     Status: None   Collection Time: 06/16/22 11:28 AM   Specimen: Nasal Mucosa; Nasal Swab  Result Value Ref Range Status   MRSA by PCR Next Gen NOT DETECTED NOT DETECTED Final    Comment: (NOTE) The GeneXpert MRSA Assay (FDA approved for NASAL specimens only), is one component of a comprehensive MRSA colonization surveillance program. It is not intended to diagnose MRSA infection nor to guide or monitor treatment for MRSA infections. Test performance is not FDA approved in patients less than 19 years old. Performed at [facility], [address] [place], [place] [address]      Labs: BNP (last 3 results) No results for input(s): "BNP" in the last 8760 hours. Basic Metabolic Panel: Recent Labs  Lab 06/16/22 0648 06/16/22 1436 06/16/22 1908 06/16/22 2234 06/17/22 0338  NA 134* 135 133* 135 137  K 3.6 3.6 5.0 4.2 3.7  CL 101 102 102 102 105  CO2 21* 21* 20* 23 24  GLUCOSE 227* 200* 138* 187* 88  BUN 13 9 6 6 6   CREATININE 0.93 0.77 0.76 0.69 0.62  CALCIUM 8.6* 8.5* 8.4* 8.8* 8.8*   Liver Function Tests: No results for input(s): "AST", "ALT", "ALKPHOS", "BILITOT", "PROT", "ALBUMIN" in the last 168 hours. No results for input(s): "LIPASE", "AMYLASE" in the last 168 hours. No results for input(s): "AMMONIA" in the last 168 hours. CBC: Recent Labs  Lab 06/15/22 2255 06/15/22 2313 06/16/22 0607  WBC 5.8  --  6.7  NEUTROABS 3.1  --   --   HGB 9.8* 12.6 10.0*  HCT 33.3* 37.0 32.3*  MCV 85.6  --  81.2  PLT 333  --  364   Cardiac Enzymes: No results for input(s): "CKTOTAL", "CKMB", "CKMBINDEX", "TROPONINI" in the last 168 hours. BNP: Invalid input(s): "POCBNP" CBG: Recent Labs  Lab  06/16/22 1334 06/16/22 1435 06/16/22 1634 06/16/22 2152 06/17/22 0802  GLUCAP 198* 183* 133* 182* 78   D-Dimer No results for input(s): "DDIMER" in the last 72 hours. Hgb A1c Recent Labs    06/16/22 0608  HGBA1C 13.9*   Lipid Profile No results for input(s): "CHOL", "HDL", "LDLCALC", "TRIG", "CHOLHDL", "LDLDIRECT" in the last 72 hours. Thyroid function studies No results for input(s): "TSH", "T4TOTAL", "T3FREE", "THYROIDAB" in the last 72 hours.  Invalid input(s): "FREET3" Anemia work up No results for input(s): "VITAMINB12", "FOLATE", "FERRITIN", "TIBC", "IRON", "RETICCTPCT" in the last 72 hours. Urinalysis    Component Value Date/Time   COLORURINE COLORLESS (A) 06/15/2022 2205   APPEARANCEUR CLEAR 06/15/2022 2205   LABSPEC 1.021 06/15/2022 2205   PHURINE 5.0 06/15/2022 2205   GLUCOSEU >=500 (A) 06/15/2022 2205   HGBUR NEGATIVE 06/15/2022 2205   BILIRUBINUR NEGATIVE  06/15/2022 2205   KETONESUR 20 (A) 06/15/2022 2205   PROTEINUR NEGATIVE 06/15/2022 2205   UROBILINOGEN 0.2 07/28/2013 1729   NITRITE NEGATIVE 06/15/2022 2205   LEUKOCYTESUR NEGATIVE 06/15/2022 2205   Sepsis Labs Recent Labs  Lab 06/15/22 2255 06/16/22 0607  WBC 5.8 6.7   Microbiology Recent Results (from the past 240 hour(s))  MRSA Next Gen by PCR, Nasal     Status: None   Collection Time: 06/16/22 11:28 AM   Specimen: Nasal Mucosa; Nasal Swab  Result Value Ref Range Status   MRSA by PCR Next Gen NOT DETECTED NOT DETECTED Final    Comment: (NOTE) The GeneXpert MRSA Assay (FDA approved for NASAL specimens only), is one component of a comprehensive MRSA colonization surveillance program. It is not intended to diagnose MRSA infection nor to guide or monitor treatment for MRSA infections. Test performance is not FDA approved in patients less than 57 years old. Performed at [facility], [address], [place], [place] [address]       SIGNED:   Marinda Elk,  MD  [facility] 06/17/2022, 9:57 AM Pager   If 7PM-7AM, please contact night-coverage [email or web address] Password [email or web address]

## 2022-06-17 NOTE — Plan of Care (Signed)
Pt ready to DC home with family. 

## 2022-07-07 ENCOUNTER — Encounter: Payer: Medicaid Other | Admitting: Nutrition

## 2022-07-08 NOTE — Progress Notes (Signed)
 History of Present Illness This is a 27 y.o. female who returns for follow-up of type 1 diabetes.  First visit since August 2023.  [name] has had multiple psychosocial barriers including taking care of her special needs daughter.  She has been unable to maintain a consistent schedule which has made meal planning and carbohydrate difficult.  She has gone back and forth between using her Tandem t:slim pump and multiple daily insulin  injections.  [name] was hospitalized with DKA on 06/15/22.  Since then she's had persistent nausea/vomiting although gastric emptying scan is normal.  She is not currently using her Dexcom CGM but just put her Tandem pump back on.  Rare finger stick glucoses are 50s to 350s.  Hgb A1C today is 11.9%.  Past Medical History:  Diagnosis Date  . Anemia   . Anxiety   . Asthma 02/07/2011  . Autoimmune thyroiditis 04/13/2012   Normal TSH at [facility]  . Depression   . Diabetes mellitus (*)    type 1  . Genital herpes simplex 02/13/2011  . High risk pregnancy due to history of preterm labor in third trimester 12/16/2017  . Infertility, female    Past Surgical History:  Procedure Laterality Date  . Dilation and curettage of uterus     No Known Allergies Current Outpatient Medications on File Prior to Visit  Medication Sig Dispense Refill  . albuterol  sulfate HFA (PROVENTIL ,VENTOLIN ,PROAIR ) 108 (90 Base) MCG/ACT inhaler Inhale into the lungs.    . ferrous sulfate  (FERROUS SULFATE ) 325 (65 FE) MG tablet Take one tablet (325 mg dose) by mouth daily. 30 tablet 3  . gabapentin (NEURONTIN) 300 mg capsule Take one capsule (300 mg dose) by mouth with breakfast as needed.    SABRA ibuprofen (ADVIL,MOTRIN) 800 mg tablet Take one tablet (800 mg dose) by mouth 3 (three) times a day for 10 days. 30 tablet 0  . linaclotide (LINZESS) 145 mcg capsule Take one capsule (145 mcg dose) by mouth daily. 30 capsule 5  . medroxyPROGESTERone (DEPO-PROVERA) 150 mg/mL injection Inject 1 mL (150 mg dose) into  the muscle every 3 (three) months. 1 mL 4  . metoclopramide  HCl (REGLAN ) 5 MG tablet Take one tablet (5 mg dose) by mouth 4 (four) times daily. 30 tablet 5  . Multiple Vitamins-Calcium (ONE-A-DAY WOMENS PO) Take 1 tablet by mouth daily.    . pantoprazole  sodium (PROTONIX ) 40 mg tablet Take one tablet (40 mg dose) by mouth 2 (two) times daily before meals. 60 tablet 3  . pravastatin sodium (PRAVACHOL) 40 mg tablet TAKE 1 TABLET(40 MG) BY MOUTH DAILY 90 tablet 1   No current facility-administered medications on file prior to visit.   Family History  Problem Relation Age of Onset  . Lupus Mother   . Graves' disease Mother   . Arthritis Mother   . Breast cancer Neg Hx   . Colon cancer Neg Hx   . Ovarian cancer Neg Hx   . Diabetes Neg Hx   . Hypertension Neg Hx   . Birth defects Neg Hx   . Spina bifida Neg Hx   . Autism Neg Hx   . Canavan disease Neg Hx   . Chromosomal disorder Neg Hx   . Congenital heart disease Neg Hx   . Cystic fibrosis Neg Hx   . Down syndrome Neg Hx   . Familial dysautonomia Neg Hx   . Fragile X syndrome Neg Hx   . Hemophilia Neg Hx   . Huntington's disease Neg Hx   .  Intellectual Disability  Neg Hx   . Muscular dystrophy Neg Hx   . PKU Neg Hx   . Tay-Sachs disease Neg Hx    Social History   Socioeconomic History  . Marital status: Single  Occupational History  . Occupation: disabled  Tobacco Use  . Smoking status: Former    Packs/day: 0.25    Years: 4.00    Additional pack years: 0.00    Total pack years: 1.00    Types: Cigarettes    Start date: 05/16/2017    Passive exposure: Past  . Smokeless tobacco: Never  Vaping Use  . Vaping Use: Never used  Substance and Sexual Activity  . Alcohol use: Not Currently  . Drug use: Not Currently  . Sexual activity: Yes    Partners: Male    Birth control/protection: None    Review of Systems Eight systems were reviewed; pertinent positives and negatives are as mentioned in the HPI.    Physical  Examination Vitals:   07/08/22 0916  BP: 110/70  Weight: 155 lb (70.3 kg)  Height: 5' 4 (1.626 m)   Body mass index is 26.61 kg/m. GENERAL:  Pleasant, well-appearing female in no distress. HEENT: Pupils equal, round and reactive to light. Extraocular movements intact. Nondilated fundoscopic exam reveals no signs of retinopathy. Mucous membranes moist with no oropharyngeal lesions. NECK: Supple with no lymphadenopathy or thyromegaly. CARDIOVASCULAR: Normal rate and regular rhythm with no murmurs, rubs or gallops; 2+ distal pulses. EXTREMITIES: Warm and well-perfused.  No clubbing, cyanosis or edema. NEUROLOGIC: Alert and oriented x3. Gait and station normal. SKIN: Warm and dry, with no rashes. No acanthosis nigricans.  Recent Results (from the past 168 hour(s))  POCT A1C   Collection Time: 07/08/22  9:20 AM  Result Value Ref Range   Hemoglobin A1c 11.9 (A) 4.8 - 5.6 %    A/P:   1.  Type 1 Diabetes:   First visit since August 2023.  [name] has had multiple psychosocial barriers including taking care of her special needs daughter.  She has been unable to maintain a consistent schedule which has made meal planning and carbohydrate difficult.  She has gone back and forth between using her Tandem t:slim pump and multiple daily insulin  injections.  [name] was hospitalized with DKA on 06/15/22.  Since then she's had persistent nausea/vomiting although gastric emptying scan is normal.  She is not currently using her Dexcom CGM but just put her Tandem pump back on.  Rare finger stick glucoses are 50s to 350s.  Hgb A1C today is 11.9%.   [name] needs significant support to overcome her current barriers for managing her diabetes.  I placed a referral to the [facility] Social Work program to provide caregiver and other resources.  I will also have the local Tandem pump trainer contact her to reassess her needs and help upgrade her pump to use Dexcom G7 sensors.  No setting changes were  made at this time.  New prescriptions were sent to her pharmacy.  I strongly recommend using Dexcom sensors and putting her pump in Control IQ hybrid closed loop mode.  2.  Hyperlipidemia:  Continue Pravastatin 40mg  daily.  3.  Nausea/Vomiting:  Glucose in the 80s this morning.  Prescription for urine ketone strips sent to pharmacy.  [name] will follow-up with Gastroenterology.  Follow-up in 3 months.

## 2022-07-14 ENCOUNTER — Ambulatory Visit: Payer: Medicaid Other | Admitting: Nutrition

## 2022-07-28 ENCOUNTER — Ambulatory Visit: Payer: Medicaid Other | Admitting: Nutrition

## 2022-10-19 NOTE — Progress Notes (Signed)
 History of Present Illness This is a 27 y.o. female who returns for follow-up of type 1 diabetes.  [name] was contacted by the [facility] Social Work program on 08/11/22 and resources were provided.  However, she had difficulty with reaching out to these resources until recently.  She continued with her insulin  injections and rare finger stick glucose checks until late August when she received supplies and resumed her Tandem t:slim pump and Dexcom G7 sensors.  Her 14 day average glucose when her wearing her pump was 234 and time in target range (glucose 70-180) was 34%.  Fasting blood sugars sometimes would come down to the low-100s.  However, blood sugars would still rise up to the 300-400s when not giving boluses for meals or when her pump ran out of insulin .  Hgb A1C today is 11.6%.     Past Medical History:  Diagnosis Date  . Anemia   . Anxiety   . Asthma 02/07/2011  . Autoimmune thyroiditis 04/13/2012   Normal TSH at [facility]  . Depression   . Diabetes mellitus (*)    type 1  . Genital herpes simplex 02/13/2011  . High risk pregnancy due to history of preterm labor in third trimester 12/16/2017  . Infertility, female    Past Surgical History:  Procedure Laterality Date  . Dilation and curettage of uterus     No Known Allergies Current Outpatient Medications on File Prior to Visit  Medication Sig Dispense Refill  . ACCU-CHEK SOFTCLIX LANCETS lancets 1 each by Other route 3 (three) times a day. 300 each 3  . acetone, urine, test (CHEK-STIX,KETOSTIX,KETOCARE) strip one strip by Does not apply route as needed. 25 each 0  . albuterol  sulfate HFA (PROVENTIL ,VENTOLIN ,PROAIR ) 108 (90 Base) MCG/ACT inhaler Inhale two puffs into the lungs every 4 (four) hours as needed for Wheezing or Shortness of Breath. 8 g 3  . BAQSIMI  TWO PACK 3 MG/DOSE POWD 3 mg by Nasal route as needed (for severe hypoglycemia). 1 each 2  . Biotin 800 MCG TABS Take by mouth daily.    . Continuous Glucose  Receiver (DEXCOM G7 RECEIVER) DEVI 1 Device by Does not apply route daily. 1 each 0  . Continuous Glucose Sensor (DEXCOM G7 SENSOR) MISC 1 Device by Does not apply route daily. 3 each 11  . Continuous Glucose Transmitter (DEXCOM G6 TRANSMITTER) MISC Apply 1 transmitter as directed every 3 months. 1 each 3  . etonogestrel (NEXPLANON) [place] implant Inject one each into the skin.    . ferrous sulfate  325 (65 FE) MG tablet Take one tablet (325 mg dose) by mouth daily. 30 tablet 3  . gabapentin (NEURONTIN) 300 mg capsule Take one capsule (300 mg dose) by mouth with breakfast as needed.    SABRA glucose blood (ACCU-CHEK GUIDE) test strip USE TO MONITOR BLOOD GLUCOSE 3 TIMES DAILY 300 each 3  . ibuprofen (ADVIL,MOTRIN) 800 mg tablet Take one tablet (800 mg dose) by mouth 3 (three) times a day for 10 days. 30 tablet 0  . insulin  aspart (NOVOLOG ) 100 UNIT/ML injection Tandem pump.  Basal 12a 0.9, 10a 0.75.  CF 40.  I:C 8.0.  Total:  60 units/day. 60 mL 3  . Insulin  Glargine (LANTUS  SOLOSTAR) 100 UNIT/ML SOPN Inject twenty Units into the skin daily. Use if not on insulin  pump. 15 mL 5  . Insulin  Pen Needle (BD PEN NEEDLE NANO 2ND GEN) 32G X 4 MM MISC one each by Does not apply route daily. 100 each 3  .  linaclotide (LINZESS) 145 mcg capsule Take one capsule (145 mcg dose) by mouth daily. 30 capsule 5  . Multiple Vitamins-Calcium (ONE-A-DAY WOMENS PO) Take 1 tablet by mouth daily.    . pantoprazole  sodium (PROTONIX ) 40 mg tablet Take one tablet (40 mg dose) by mouth 2 (two) times daily before meals. 60 tablet 3  . pravastatin sodium (PRAVACHOL) 40 mg tablet TAKE 1 TABLET(40 MG) BY MOUTH DAILY 90 tablet 1   No current facility-administered medications on file prior to visit.   Family History  Problem Relation Age of Onset  . Lupus Mother   . Graves' disease Mother   . Arthritis Mother   . Breast cancer Neg Hx   . Colon cancer Neg Hx   . Ovarian cancer Neg Hx   . Diabetes Neg Hx   . Hypertension Neg Hx   .  Birth defects Neg Hx   . Spina bifida Neg Hx   . Autism Neg Hx   . Canavan disease Neg Hx   . Chromosomal disorder Neg Hx   . Congenital heart disease Neg Hx   . Cystic fibrosis Neg Hx   . Down syndrome Neg Hx   . Familial dysautonomia Neg Hx   . Fragile X syndrome Neg Hx   . Hemophilia Neg Hx   . Huntington's disease Neg Hx   . Intellectual Disability  Neg Hx   . Muscular dystrophy Neg Hx   . PKU Neg Hx   . Tay-Sachs disease Neg Hx    Social History   Socioeconomic History  . Marital status: Single  Occupational History  . Occupation: disabled  Tobacco Use  . Smoking status: Former    Packs/day: 0.25    Years: 4.00    Additional pack years: 0.00    Total pack years: 1.00    Types: Cigarettes    Start date: 05/16/2017    Passive exposure: Past  . Smokeless tobacco: Never  Vaping Use  . Vaping Use: Never used  Substance and Sexual Activity  . Alcohol use: Not Currently  . Drug use: Not Currently  . Sexual activity: Yes    Partners: Male    Birth control/protection: None    Review of Systems Eight systems were reviewed; pertinent positives and negatives are as mentioned in the HPI.    Physical Examination Vitals:   10/19/22 1403  BP: 128/76  Weight: 160 lb (72.6 kg)  Height: 5' 4 (1.626 m)   Body mass index is 27.46 kg/m. GENERAL:  Pleasant, well-appearing female in no distress. HEENT: Pupils equal, round and reactive to light. Extraocular movements intact. Nondilated fundoscopic exam reveals no signs of retinopathy. Mucous membranes moist with no oropharyngeal lesions. NECK: Supple with no lymphadenopathy or thyromegaly. CARDIOVASCULAR: Normal rate and regular rhythm with no murmurs, rubs or gallops; 2+ distal pulses. EXTREMITIES: Warm and well-perfused.  No clubbing, cyanosis or edema. NEUROLOGIC: Alert and oriented x3. Gait and station normal. SKIN: Warm and dry, with no rashes. No acanthosis nigricans. Diabetic foot exam:  Left: Monofilament test:  Sensation normal  Pulses: present  Skin: Normal and no erythema, no cyanosis or pallor   Other findings: none Right: Monofilament test: Sensation normal  Pulses: present  Skin: Normal and no erythema, no cyanosis or pallor   Other findings: none Exam performed with shoes and socks removed.   Recent Results (from the past 168 hour(s))  POCT A1C   Collection Time: 10/19/22  2:09 PM  Result Value Ref Range   Hemoglobin A1c 11.6 (  A) 4.8 - 5.6 %    A/P:   1.  Type 1 Diabetes:  Hgb A1C 11.6% today.  [name] was contacted by the [facility] Social Work program on 08/11/22 and resources were provided.  However, she had difficulty with reaching out to these resources until recently.  She continued with her insulin  injections and rare finger stick glucose checks until late August when she received supplies and resumed her Tandem t:slim pump and Dexcom G7 sensors.  Her 14 day average glucose when her wearing her pump was 234 and time in target range (glucose 70-180) was 34%.  Fasting blood sugars sometimes would come down to the low-100s.  However, blood sugars would still rise up to the 300-400s when not giving boluses for meals or when her pump ran out of insulin  or her pharmacy was out of stock with insulin .  [name] has seen the benefits of using a sensor-augmented insulin  pump.  I encouraged her enter carbohydrates for all meals and snacks into her pump for appropriate bolus dosing to keep blood sugars down.  I also asked her to contact me immediately to send an insulin  prescription to a different pharmacy if her preferred one is out of stock.  No pump setting changes were made at today's visit.  2.  Hyperlipidemia:  Continue Pravastatin 40mg  daily.  Follow-up in 3 months.

## 2023-06-13 NOTE — Progress Notes (Signed)
 Patient ID:  [name] is a 28 y.o. (DOB [birth year]) female.   Subjective      Patient presents with  . Follow-up    Pt requesting iron levels to be recheck. Pt c/o constipation since taking iron supplements, wants alternative if possible      History of Present Illness [name] presents for evaluation of iron deficiency, acid reflux.  She reports experiencing fatigue, low energy levels, and difficulty staying awake, which she attributes to her known condition of iron deficiency. Over-the-counter iron supplements were used but discontinued due to constipation. Alternative methods of iron supplementation are being sought.  Currently, a daily regimen of pantoprazole  40 mg is taken for acid reflux. Recently, food intake has been reduced to one meal per day, described as small or normal in size. Spicy foods, carbonated beverages, and caffeine  have been identified as triggers for reflux symptoms. A recent episode of reflux occurred after consuming a vegetable-based meal at a health restaurant, despite no prior reactions to similar meals. Vomitus appears undigested, and a burning sensation was experienced after consuming baked fish and corn yesterday, a symptom not encountered for some time. An increase in pantoprazole  dosage is requested. Severe reflux symptoms have prevented eating, although solid food intake has recently resumed after a period of subsisting on chicken broth and Jell-O.  Diabetes is being managed effectively, with regular gym attendance maintaining an active lifestyle. No current chest pain or shortness of breath is reported.      Reviewed and updated this visit by provider: Tobacco  Allergies  Meds  Problems  Med Hx  Surg Hx  Fam Hx        Review of Systems  Negative except as noted in History of Present Illness.  Objective   BP 114/80 (BP Location: Left Upper Arm, Patient Position: Sitting)   Pulse 87   Temp 97.3 F (36.3 C) (Temporal)   Ht 5' 4  (1.626 m)   Wt 170 lb 3.2 oz (77.2 kg)   SpO2 98%   BMI 29.21 kg/m  Physical Exam    Physical Exam Constitutional:      Appearance: Normal appearance.  Eyes:     Extraocular Movements: Extraocular movements intact.     Pupils: Pupils are equal, round, and reactive to light.  Cardiovascular:     Rate and Rhythm: Normal rate and regular rhythm.     Pulses: Normal pulses.     Heart sounds: Normal heart sounds.  Pulmonary:     Effort: Pulmonary effort is normal.     Breath sounds: Normal breath sounds.  Skin:    General: Skin is warm and dry.  Neurological:     Mental Status: She is alert and oriented to person, place, and time.  Psychiatric:        Mood and Affect: Mood normal.        Behavior: Behavior normal.       Assessment/Plan   Assessment & Plan    Assessment & Plan Mild acid reflux  Orders: .  pantoprazole  sodium (PROTONIX ) 40 mg tablet; Take one tablet (40 mg dose) by mouth 2 (two) times daily before meals. 0 .  Ambulatory referral to Gastroenterology - Currently on pantoprazole  40 mg daily but experiencing significant reflux symptoms. - Advised to avoid spicy foods, carbonated beverages, and caffeine . - Referral to a gastroenterologist will be made for further evaluation and management. - Advised to eat smaller, more frequent meals instead of one large meal per day. Fatigue, unspecified  type  Orders: .  Fe+TIBC+Fer; Future .  CBC And Differential; Future - Reports fatigue and low energy, potentially due to low iron levels. - Over-the-counter iron pills cause constipation. - Comprehensive blood panel, including iron, ferritin, and complete blood count, will be ordered. - Handouts on foods rich in iron given to patient during visit.    Risks, benefits, and alternatives of the medications and treatment plan prescribed today were discussed, and patient expressed understanding. Follow up in about 2 months (around 08/13/2023) for annual cpe.   Christopher Bohr,  NP 06/13/2023, 9:36 AM    Orders Placed This Encounter  Procedures  . Fe+TIBC+Fer  . CBC And Differential  . Ambulatory referral to Gastroenterology   Patient's Medications  New Prescriptions   No medications on file  Previous Medications   ACCU-CHEK SOFTCLIX LANCETS LANCETS    1 each by Other route 3 (three) times a day.   ACETONE, URINE, TEST (CHEK-STIX,KETOSTIX,KETOCARE) STRIP    one strip by Does not apply route as needed.   ALBUTEROL  SULFATE HFA (PROVENTIL ,VENTOLIN ,PROAIR ) 108 (90 BASE) MCG/ACT INHALER    Inhale two puffs into the lungs every 4 (four) hours as needed for Wheezing or Shortness of Breath.   BAQSIMI  TWO PACK 3 MG/DOSE POWD    3 mg by Nasal route as needed (for severe hypoglycemia).   BIOTIN 800 MCG TABS    Take by mouth daily.   CONTINUOUS GLUCOSE RECEIVER (DEXCOM G7 RECEIVER) DEVI    1 Device by Does not apply route daily.   CONTINUOUS GLUCOSE SENSOR (DEXCOM G7 SENSOR) MISC    1 Device by Does not apply route daily.   CONTINUOUS GLUCOSE TRANSMITTER (DEXCOM G6 TRANSMITTER) MISC    Apply 1 transmitter as directed every 3 months.   ETONOGESTREL (NEXPLANON) [place] IMPLANT    Inject one each into the skin.   FERROUS SULFATE  325 (65 FE) MG TABLET    Take one tablet (325 mg dose) by mouth daily.   GABAPENTIN (NEURONTIN) 300 MG CAPSULE    Take one capsule (300 mg dose) by mouth with breakfast as needed.   GLUCOSE BLOOD (ACCU-CHEK GUIDE) TEST STRIP    USE TO MONITOR BLOOD GLUCOSE 3 TIMES DAILY   IBUPROFEN (ADVIL,MOTRIN) 800 MG TABLET    Take one tablet (800 mg dose) by mouth 3 (three) times a day for 10 days.   INSULIN  ASPART (NOVOLOG ) 100 UNIT/ML INJECTION    Tandem pump.  Basal 12a 0.9, 10a 0.75.  CF 40.  I:C 8.0.  Total:  60 units/day.   INSULIN  GLARGINE (LANTUS  SOLOSTAR) 100 UNIT/ML SOPN    Inject twenty Units into the skin daily. Use if not on insulin  pump.   INSULIN  PEN NEEDLE (BD PEN NEEDLE NANO 2ND GEN) 32G X 4 MM MISC    one each by Does not apply route daily.    LINACLOTIDE (LINZESS) 145 MCG CAPSULE    Take one capsule (145 mcg dose) by mouth daily. Please schedule office visit for future refills.   MULTIPLE VITAMINS-CALCIUM (ONE-A-DAY WOMENS PO)    Take 1 tablet by mouth daily.   PRAVASTATIN SODIUM (PRAVACHOL) 40 MG TABLET    TAKE 1 TABLET(40 MG) BY MOUTH DAILY  Modified Medications   Modified Medication Previous Medication   PANTOPRAZOLE  SODIUM (PROTONIX ) 40 MG TABLET pantoprazole  sodium (PROTONIX ) 40 mg tablet      Take one tablet (40 mg dose) by mouth 2 (two) times daily before meals. 0    TAKE 1 TABLET(40 MG) BY MOUTH TWICE  DAILY BEFORE MEALS  Discontinued Medications   No medications on file     Computer technology was used to create visit note. Consent from the patient/caregiver was obtained prior to its use.

## 2023-06-28 ENCOUNTER — Emergency Department (HOSPITAL_COMMUNITY)

## 2023-06-28 ENCOUNTER — Emergency Department (HOSPITAL_COMMUNITY)
Admission: EM | Admit: 2023-06-28 | Discharge: 2023-06-29 | Disposition: A | Discharge status: Home / Self Care | Attending: Emergency Medicine | Admitting: Emergency Medicine

## 2023-06-28 ENCOUNTER — Other Ambulatory Visit: Payer: Self-pay

## 2023-06-28 ENCOUNTER — Encounter (HOSPITAL_COMMUNITY): Payer: Self-pay

## 2023-06-28 DIAGNOSIS — R739 Hyperglycemia, unspecified: Secondary | ICD-10-CM | POA: Insufficient documentation

## 2023-06-28 DIAGNOSIS — R1030 Lower abdominal pain, unspecified: Secondary | ICD-10-CM | POA: Diagnosis present

## 2023-06-28 DIAGNOSIS — N3000 Acute cystitis without hematuria: Secondary | ICD-10-CM | POA: Diagnosis not present

## 2023-06-28 HISTORY — DX: Adenomyosis of the uterus: N80.03

## 2023-06-28 LAB — CBC WITH DIFFERENTIAL/PLATELET
Abs Immature Granulocytes: 0.01 10*3/uL (ref 0.00–0.07)
Basophils Absolute: 0 10*3/uL (ref 0.0–0.1)
Basophils Relative: 0 %
Eosinophils Absolute: 0 10*3/uL (ref 0.0–0.5)
Eosinophils Relative: 0 %
HCT: 39.7 % (ref 36.0–46.0)
Hemoglobin: 12.2 g/dL (ref 12.0–15.0)
Immature Granulocytes: 0 %
Lymphocytes Relative: 25 %
Lymphs Abs: 1.8 10*3/uL (ref 0.7–4.0)
MCH: 25.2 pg — ABNORMAL LOW (ref 26.0–34.0)
MCHC: 30.7 g/dL (ref 30.0–36.0)
MCV: 82 fL (ref 80.0–100.0)
Monocytes Absolute: 0.4 10*3/uL (ref 0.1–1.0)
Monocytes Relative: 5 %
Neutro Abs: 5 10*3/uL (ref 1.7–7.7)
Neutrophils Relative %: 70 %
Platelets: 374 10*3/uL (ref 150–400)
RBC: 4.84 MIL/uL (ref 3.87–5.11)
RDW: 14.4 % (ref 11.5–15.5)
WBC: 7.3 10*3/uL (ref 4.0–10.5)
nRBC: 0 % (ref 0.0–0.2)

## 2023-06-28 LAB — COMPREHENSIVE METABOLIC PANEL WITH GFR
ALT: 14 U/L (ref 0–44)
AST: 21 U/L (ref 15–41)
Albumin: 4.1 g/dL (ref 3.5–5.0)
Alkaline Phosphatase: 79 U/L (ref 38–126)
Anion gap: 14 (ref 5–15)
BUN: 18 mg/dL (ref 6–20)
CO2: 21 mmol/L — ABNORMAL LOW (ref 22–32)
Calcium: 9.8 mg/dL (ref 8.9–10.3)
Chloride: 98 mmol/L (ref 98–111)
Creatinine, Ser: 1.03 mg/dL — ABNORMAL HIGH (ref 0.44–1.00)
GFR, Estimated: >60 mL/min (ref >60)
Glucose, Bld: 348 mg/dL — ABNORMAL HIGH (ref 70–99)
Potassium: 4.2 mmol/L (ref 3.5–5.1)
Sodium: 133 mmol/L — ABNORMAL LOW (ref 135–145)
Total Bilirubin: 0.7 mg/dL (ref 0.0–1.2)
Total Protein: 9.5 g/dL — ABNORMAL HIGH (ref 6.5–8.1)

## 2023-06-28 LAB — URINALYSIS, W/ REFLEX TO CULTURE (INFECTION SUSPECTED)
Bilirubin Urine: NEGATIVE
Glucose, UA: >=500 mg/dL — AB
Ketones, ur: 20 mg/dL — AB
Nitrite: POSITIVE — AB
Protein, ur: 100 mg/dL — AB
Specific Gravity, Urine: 1.028 (ref 1.005–1.030)
pH: 5 (ref 5.0–8.0)

## 2023-06-28 LAB — BLOOD GAS, VENOUS
Acid-Base Excess: 3.4 mmol/L — ABNORMAL HIGH (ref 0.0–2.0)
Bicarbonate: 29.1 mmol/L — ABNORMAL HIGH (ref 20.0–28.0)
Drawn by: 51891
O2 Saturation: 38.1 %
Patient temperature: 37
pCO2, Ven: 48 mmHg (ref 44–60)
pH, Ven: 7.39 (ref 7.25–7.43)
pO2, Ven: <31 mmHg — CL (ref 32–45)

## 2023-06-28 LAB — CBG MONITORING, ED: Glucose-Capillary: 309 mg/dL — ABNORMAL HIGH (ref 70–99)

## 2023-06-28 LAB — LIPASE, BLOOD: Lipase: 108 U/L — ABNORMAL HIGH (ref 11–51)

## 2023-06-28 LAB — HCG, QUANTITATIVE, PREGNANCY: hCG, Beta Chain, Quant, S: <1 m[IU]/mL (ref <5)

## 2023-06-28 LAB — BETA-HYDROXYBUTYRIC ACID: Beta-Hydroxybutyric Acid: 1.01 mmol/L — ABNORMAL HIGH (ref 0.05–0.27)

## 2023-06-28 MED ORDER — IOHEXOL 300 MG/ML  SOLN
100.0000 mL | Freq: Once | INTRAMUSCULAR | Status: AC | PRN
  Administered 2023-06-28: 100 mL via INTRAVENOUS

## 2023-06-28 MED ORDER — ONDANSETRON 8 MG PO TBDP: 8.0000 mg | ORAL_TABLET | Freq: Three times a day (TID) | ORAL | 0 refills | Status: AC | PRN

## 2023-06-28 MED ORDER — CEPHALEXIN 500 MG PO CAPS: 500.0000 mg | ORAL_CAPSULE | Freq: Four times a day (QID) | ORAL | 0 refills | Status: DC

## 2023-06-28 MED ORDER — ONDANSETRON HCL 4 MG/2ML IJ SOLN
4.0000 mg | Freq: Once | INTRAMUSCULAR | Status: AC
  Administered 2023-06-28: 4 mg via INTRAVENOUS
  Filled 2023-06-28: qty 2

## 2023-06-28 MED ORDER — LACTATED RINGERS IV BOLUS
1000.0000 mL | Freq: Once | INTRAVENOUS | Status: AC
  Administered 2023-06-28: 1000 mL via INTRAVENOUS

## 2023-06-28 MED ORDER — INSULIN ASPART 100 UNIT/ML IJ SOLN
6.0000 [IU] | Freq: Once | INTRAMUSCULAR | Status: AC
  Administered 2023-06-28: 6 [IU] via SUBCUTANEOUS
  Filled 2023-06-28: qty 0.06

## 2023-06-28 MED ORDER — SODIUM CHLORIDE 0.9 % IV SOLN
1.0000 g | Freq: Once | INTRAVENOUS | Status: AC
  Administered 2023-06-28: 1 g via INTRAVENOUS
  Filled 2023-06-28: qty 10

## 2023-06-28 NOTE — Discharge Instructions (Addendum)
 Take the antibiotics as prescribed.  Make sure to take your insulin  regularly.  Follow-up with your primary care doctor to be rechecked and follow-up with the GI doctor as planned.  Return to the ER for worsening symptoms

## 2023-06-28 NOTE — ED Provider Notes (Signed)
 [place] [facility] AT [facility] Provider Note   CSN: [id number] Arrival date & time: 06/28/23  1321     History  Chief Complaint  Patient presents with   Hyperglycemia    [name] is a 28 y.o. female.   Hyperglycemia    Pt states she has been having issues with heavy menses for the past week.  She has been having trouble with cramping in the lower abdomen.  Patient states the pain has been also going to her back pt has had high blood sugars.  No dysuria.  She has not been eating or drinking as much.  No fevers.  No vomiting.  Patient states she has not been taking as much of her insulin  because of her decreased p.o. intake.  Home Medications Prior to Admission medications   Medication Sig Start Date End Date Taking? Authorizing Provider  cephALEXin  (KEFLEX ) 500 MG capsule Take 1 capsule (500 mg total) by mouth 4 (four) times daily. 06/28/23  Yes Trish Furl, MD  ondansetron  (ZOFRAN -ODT) 8 MG disintegrating tablet Take 1 tablet (8 mg total) by mouth every 8 (eight) hours as needed for nausea or vomiting. 06/28/23  Yes Trish Furl, MD  Blood Gluc Meter Disp-Strips (BLOOD GLUCOSE METER DISPOSABLE) DEVI Use as directed 07/30/13   Krishnan, Sendil K, MD  etonogestrel (NEXPLANON) 68 MG IMPL implant 1 each by Subdermal route once.    [provider]  insulin  aspart (NOVOLOG ) 100 UNIT/ML FlexPen Inject 10 Units into the skin 3 (three) times daily with meals. 07/30/13   Krishnan, Sendil K, MD  insulin  glargine (LANTUS ) 100 UNIT/ML injection Inject 20 Units into the skin daily as needed (high blood sugar).    [provider]  Insulin  Pen Needle (PEN NEEDLES 3/16") 31G X 5 MM MISC Use as directed 07/30/13   Krishnan, Sendil K, MD  Insulin  Pen Needle 31G X 5 MM MISC Use with insulin  pen 08/17/12   Williemae Harsh, MD  loratadine  (CLARITIN ) 10 MG tablet Take 1 tablet (10 mg total) by mouth daily. Patient not taking: Reported on 07/26/2021 07/30/13    Krishnan, Sendil K, MD  metoCLOPramide  (REGLAN ) 5 MG tablet Take 1 tablet (5 mg total) by mouth 4 (four) times daily -  before meals and at bedtime. Patient not taking: Reported on 06/16/2022 07/29/21 07/29/22  Gonfa, Taye T, MD  pantoprazole  (PROTONIX ) 40 MG tablet Take 1 tablet (40 mg total) by mouth daily. 07/29/21 09/27/21  Gonfa, Taye T, MD  pravastatin (PRAVACHOL) 40 MG tablet Take 40 mg by mouth every evening. 07/14/21   [provider]      Allergies    Patient has no known allergies.    Review of Systems   Review of Systems  Physical Exam Updated Vital Signs BP 119/77   Pulse 80   Temp 98.2 F (36.8 C) (Oral)   Resp 16   Ht 1.626 m (5\' 4" )   Wt 77.6 kg   SpO2 100%   BMI 29.35 kg/m  Physical Exam Vitals and nursing note reviewed.  Constitutional:      General: She is not in acute distress.    Appearance: She is well-developed.  HENT:     Head: Normocephalic and atraumatic.     Right Ear: External ear normal.     Left Ear: External ear normal.  Eyes:     General: No scleral icterus.       Right eye: No discharge.  Left eye: No discharge.     Conjunctiva/sclera: Conjunctivae normal.  Neck:     Trachea: No tracheal deviation.  Cardiovascular:     Rate and Rhythm: Normal rate and regular rhythm.  Pulmonary:     Effort: Pulmonary effort is normal. No respiratory distress.     Breath sounds: Normal breath sounds. No stridor. No wheezing or rales.  Abdominal:     General: Bowel sounds are normal. There is no distension.     Palpations: Abdomen is soft.     Tenderness: There is no abdominal tenderness. There is no guarding or rebound.  Musculoskeletal:        General: No tenderness or deformity.     Cervical back: Neck supple.  Skin:    General: Skin is warm and dry.     Findings: No rash.  Neurological:     General: No focal deficit present.     Mental Status: She is alert.     Cranial Nerves: No cranial nerve deficit, dysarthria or facial  asymmetry.     Sensory: No sensory deficit.     Motor: No abnormal muscle tone or seizure activity.     Coordination: Coordination normal.  Psychiatric:        Mood and Affect: Mood normal.     ED Results / Procedures / Treatments   Labs (all labs ordered are listed, but only abnormal results are displayed) Labs Reviewed  CBC WITH DIFFERENTIAL/PLATELET - Abnormal; Notable for the following components:      Result Value   MCH 25.2 (*)    All other components within normal limits  COMPREHENSIVE METABOLIC PANEL WITH GFR - Abnormal; Notable for the following components:   Sodium 133 (*)    CO2 21 (*)    Glucose, Bld 348 (*)    Creatinine, Ser 1.03 (*)    Total Protein 9.5 (*)    All other components within normal limits  LIPASE, BLOOD - Abnormal; Notable for the following components:   Lipase 108 (*)    All other components within normal limits  URINALYSIS, W/ REFLEX TO CULTURE (INFECTION SUSPECTED) - Abnormal; Notable for the following components:   APPearance HAZY (*)    Glucose, UA >=500 (*)    Hgb urine dipstick MODERATE (*)    Ketones, ur 20 (*)    Protein, ur 100 (*)    Nitrite POSITIVE (*)    Leukocytes,Ua TRACE (*)    Bacteria, UA RARE (*)    All other components within normal limits  BETA-HYDROXYBUTYRIC ACID - Abnormal; Notable for the following components:   Beta-Hydroxybutyric Acid 1.01 (*)    All other components within normal limits  BLOOD GAS, VENOUS - Abnormal; Notable for the following components:   pO2, Ven <31 (*)    Bicarbonate 29.1 (*)    Acid-Base Excess 3.4 (*)    All other components within normal limits  CBG MONITORING, ED - Abnormal; Notable for the following components:   Glucose-Capillary 309 (*)    All other components within normal limits  URINE CULTURE  HCG, QUANTITATIVE, PREGNANCY    EKG None  Radiology CT ABDOMEN PELVIS W CONTRAST Result Date: 06/28/2023 CLINICAL DATA:  Abdominal pain, acute, nonlocalized Pt states her blood sugar  has been sporadic at home, last check was around 400. Pt states she has been menstruating for 7 days and has abdominal cramping with heavy bleeding, using more than 3 super tampons in an hour. EXAM: CT ABDOMEN AND PELVIS WITH CONTRAST TECHNIQUE: Multidetector  CT imaging of the abdomen and pelvis was performed using the standard protocol following bolus administration of intravenous contrast. RADIATION DOSE REDUCTION: This exam was performed according to the departmental dose-optimization program which includes automated exposure control, adjustment of the mA and/or kV according to patient size and/or use of iterative reconstruction technique. CONTRAST:  OMNIPAQUE IOHEXOL 300 MG/ML  SOLN COMPARISON:  CT abdomen pelvis 07/26/2021 FINDINGS: Lower chest: No acute abnormality. Hepatobiliary: No focal liver abnormality. Calcified gallstone noted within the gallbladder lumen. No gallbladder wall thickening or pericholecystic fluid. No biliary dilatation. Pancreas: No focal lesion. Normal pancreatic contour. No surrounding inflammatory changes. No main pancreatic ductal dilatation. Spleen: Normal in size without focal abnormality. Adrenals/Urinary Tract: No adrenal nodule bilaterally. Bilateral kidneys enhance symmetrically. No hydronephrosis. No hydroureter. The urinary bladder is decompressed with circumferential urinary bladder wall thickening likely due to under distension. No perivesicular fat stranding. Stomach/Bowel: Stomach is within normal limits. No evidence of bowel wall thickening or dilatation. Appendix appears normal. Vascular/Lymphatic: No abdominal aorta or iliac aneurysm. No abdominal, pelvic, or inguinal lymphadenopathy. Reproductive: Uterus and bilateral adnexa are unremarkable. No findings to suggest thickened endometrium. Other: No intraperitoneal free fluid. No intraperitoneal free gas. No organized fluid collection. Musculoskeletal: No abdominal wall hernia or abnormality. No suspicious lytic or  blastic osseous lesions. No acute displaced fracture. IMPRESSION: Cholelithiasis with no acute cholecystitis. Electronically Signed   By: Morgane  Naveau M.D.   On: 06/28/2023 20:14   US  Pelvis Complete Result Date: 06/28/2023 CLINICAL DATA:  Abdominal cramping and heavy bleeding EXAM: TRANSABDOMINAL AND TRANSVAGINAL ULTRASOUND OF PELVIS DOPPLER ULTRASOUND OF OVARIES TECHNIQUE: Both transabdominal and transvaginal ultrasound examinations of the pelvis were performed. Transabdominal technique was performed for global imaging of the pelvis including uterus, ovaries, adnexal regions, and pelvic cul-de-sac. It was necessary to proceed with endovaginal exam following the transabdominal exam to visualize the ovaries. Color and duplex Doppler ultrasound was utilized to evaluate blood flow to the ovaries. COMPARISON:  CT abdomen and pelvis dated 07/26/2021 FINDINGS: Uterus Measurements: 5.9 cm in sagittal dimension. No fibroids or other mass visualized. Endometrium Thickness: 8 mm.  No focal abnormality visualized. Right ovary Measurements: 3.6 x 1.8 x 1.7 cm = volume: 5.8 mL. Normal appearance. No adnexal mass. Left ovary Measurements: 4.4 x 1.9 x 1.4 cm = volume: 6.1 mL. Normal appearance. No adnexal mass. Pulsed Doppler evaluation of both ovaries demonstrates normal low-resistance arterial and venous waveforms. Other findings Small volume pelvic free fluid. IMPRESSION: No sonographic finding of ovarian torsion. Electronically Signed   By: Limin  Xu M.D.   On: 06/28/2023 16:09   US  Transvaginal Non-OB Result Date: 06/28/2023 CLINICAL DATA:  Abdominal cramping and heavy bleeding EXAM: TRANSABDOMINAL AND TRANSVAGINAL ULTRASOUND OF PELVIS DOPPLER ULTRASOUND OF OVARIES TECHNIQUE: Both transabdominal and transvaginal ultrasound examinations of the pelvis were performed. Transabdominal technique was performed for global imaging of the pelvis including uterus, ovaries, adnexal regions, and pelvic cul-de-sac. It was  necessary to proceed with endovaginal exam following the transabdominal exam to visualize the ovaries. Color and duplex Doppler ultrasound was utilized to evaluate blood flow to the ovaries. COMPARISON:  CT abdomen and pelvis dated 07/26/2021 FINDINGS: Uterus Measurements: 5.9 cm in sagittal dimension. No fibroids or other mass visualized. Endometrium Thickness: 8 mm.  No focal abnormality visualized. Right ovary Measurements: 3.6 x 1.8 x 1.7 cm = volume: 5.8 mL. Normal appearance. No adnexal mass. Left ovary Measurements: 4.4 x 1.9 x 1.4 cm = volume: 6.1 mL. Normal appearance. No adnexal mass. Pulsed Doppler  evaluation of both ovaries demonstrates normal low-resistance arterial and venous waveforms. Other findings Small volume pelvic free fluid. IMPRESSION: No sonographic finding of ovarian torsion. Electronically Signed   By: Limin  Xu M.D.   On: 06/28/2023 16:09   US  Art/Ven Flow Abd Pelv Doppler Result Date: 06/28/2023 CLINICAL DATA:  Abdominal cramping and heavy bleeding EXAM: TRANSABDOMINAL AND TRANSVAGINAL ULTRASOUND OF PELVIS DOPPLER ULTRASOUND OF OVARIES TECHNIQUE: Both transabdominal and transvaginal ultrasound examinations of the pelvis were performed. Transabdominal technique was performed for global imaging of the pelvis including uterus, ovaries, adnexal regions, and pelvic cul-de-sac. It was necessary to proceed with endovaginal exam following the transabdominal exam to visualize the ovaries. Color and duplex Doppler ultrasound was utilized to evaluate blood flow to the ovaries. COMPARISON:  CT abdomen and pelvis dated 07/26/2021 FINDINGS: Uterus Measurements: 5.9 cm in sagittal dimension. No fibroids or other mass visualized. Endometrium Thickness: 8 mm.  No focal abnormality visualized. Right ovary Measurements: 3.6 x 1.8 x 1.7 cm = volume: 5.8 mL. Normal appearance. No adnexal mass. Left ovary Measurements: 4.4 x 1.9 x 1.4 cm = volume: 6.1 mL. Normal appearance. No adnexal mass. Pulsed Doppler  evaluation of both ovaries demonstrates normal low-resistance arterial and venous waveforms. Other findings Small volume pelvic free fluid. IMPRESSION: No sonographic finding of ovarian torsion. Electronically Signed   By: Limin  Xu M.D.   On: 06/28/2023 16:09    Procedures Procedures    Medications Ordered in ED Medications  lactated ringers  bolus 1,000 mL (0 mLs Intravenous Stopped 06/28/23 2049)  insulin  aspart (novoLOG ) injection 6 Units (6 Units Subcutaneous Given 06/28/23 1911)  ondansetron  (ZOFRAN ) injection 4 mg (4 mg Intravenous Given 06/28/23 1911)  iohexol (OMNIPAQUE) 300 MG/ML solution 100 mL (100 mLs Intravenous Contrast Given 06/28/23 1956)  cefTRIAXone  (ROCEPHIN ) 1 g in sodium chloride  0.9 % 100 mL IVPB (0 g Intravenous Stopped 06/28/23 2129)    ED Course/ Medical Decision Making/ A&P Clinical Course as of 06/28/23 2131  Tue Jun 28, 2023  2006 Beta-hydroxybutyric acid(!) Beta-hydroxybutyrate is elevated [JK]  2007 Blood gas, venous (at [facility] and [facility])(!!) Venous blood gas without acidosis. [JK]  2008 Ultrasound without signs of ovarian torsion [JK]  2008 Urinalysis, w/ Reflex to Culture (Infection Suspected) -Urine, Clean Catch(!) Suggestive of possible uti  [JK]  2053 CT scan shows evidence of cholelithiasis without acute cholecystitis. [JK]  2127 Patient is not having any pain or discomfort.  No difficulty with vomiting. [JK]    Clinical Course User Index [JK] Trish Furl, MD                                 Medical Decision Making Amount and/or Complexity of Data Reviewed Labs: ordered. Decision-making details documented in ED Course.  Risk Prescription drug management.   Patient reported pain primarily in her lower abdomen.  She has also noticed high blood sugars.  Consider the possibility of DKA.  Patient does have an increased beta hydroxy butyrate acid as well as hyperglycemia however patient does not have an anion gap acidosis.  Her pH is normal.  Patient is not  having any nausea vomiting here.  She admits to not taking all her insulin .  Patient was given insulin  in the ED.  I think she can be managed closely as an outpatient.  Discussed importance of taking her insulin  regularly.  Patient's imaging test do not show any signs of acute abnormality.  Patient does not have  any evidence of torsion.  CT scan shows cholelithiasis but no signs of cholecystitis.  Patient's pain is primarily been in her lower abdomen.  She is aware of the gallstone.  Patient's lipase is slightly elevated.  However previously it was higher.  Low suspicion for acute pancreatitis.  Patient does have evidence of UTI.  Will start a course of antibiotics.  Recommend close follow-up and to return to the ED if she has any recurrent or worsening symptoms.        Final Clinical Impression(s) / ED Diagnoses Final diagnoses:  Hyperglycemia  Acute cystitis without hematuria    Rx / DC Orders ED Discharge Orders          Ordered    cephALEXin  (KEFLEX ) 500 MG capsule  4 times daily        06/28/23 2128    ondansetron  (ZOFRAN -ODT) 8 MG disintegrating tablet  Every 8 hours PRN        06/28/23 2128              Trish Furl, MD 06/28/23 2131

## 2023-06-28 NOTE — ED Provider Triage Note (Signed)
 Emergency Medicine Provider Triage Evaluation Note  [name] , a 28 y.o. female  was evaluated in triage.  Pt complains of hyperglycemia and abdominal pain.  Review of Systems  Positive:  Negative:   Physical Exam  BP 127/78 (BP Location: Left Arm)   Pulse 96   Temp 98.5 F (36.9 C) (Oral)   Resp 16   Ht 5\' 4"  (1.626 m)   Wt 77.6 kg   SpO2 98%   BMI 29.35 kg/m  Gen:   Awake, no distress   Resp:  Normal effort  MSK:   Moves extremities without difficulty  Other:    Medical Decision Making  Medically screening exam initiated at 2:35 PM.  Appropriate orders placed.  [name] was informed that the remainder of the evaluation will be completed by another provider, this initial triage assessment does not replace that evaluation, and the importance of remaining in the ED until their evaluation is complete.  Home CBG around 400. Also concerned for severe heavy vaginal bleeding which is different from her normal menstrual cycles. Patient not able to localize her abdominal pain well - states that maybe it is central and also low back pain.    [place], [place] 06/28/23 1437

## 2023-06-28 NOTE — ED Triage Notes (Signed)
 Pt states her blood sugar has been sporadic at home, last check was around 400. Pt states she has been menstruating for 7 days and has abdominal cramping with heavy bleeding, using more than 3 super tampons in an hour.

## 2023-06-29 NOTE — ED Notes (Signed)
 Pt not present at shift arrival. Will be removed from system

## 2023-06-30 LAB — URINE CULTURE

## 2023-07-01 ENCOUNTER — Telehealth (HOSPITAL_BASED_OUTPATIENT_CLINIC_OR_DEPARTMENT_OTHER): Payer: Self-pay

## 2023-07-01 NOTE — Telephone Encounter (Signed)
 Post ED Visit - Positive Culture Follow-up  Culture report reviewed by antimicrobial stewardship pharmacist: [facility] []  Court [place], Pharm.D. []  Skeet Duke, Pharm.D., BCPS AQ-ID []  Leslee Rase, Pharm.D., BCPS [x]  Garland Junk, Pharm.D., BCPS []  [place], [address].D., BCPS, AAHIVP []  Alcide Aly, Pharm.D., BCPS, AAHIVP []  Jerri Morale, PharmD, BCPS []  Graham Laws, PharmD, BCPS []  Cleda Curly, PharmD, BCPS []  Tamar Fairly, PharmD []  Ballard Levels, PharmD, BCPS []  Ollen Beverage, PharmD  [facility] []  Arlyne Bering, PharmD []  Sherryle Don, PharmD []  [place]elin[place], PharmD []  Delila Felty, Rph []  [place]) Cleora Daft, PharmD []  Augustina Block, PharmD []  Arie Kurtz, PharmD []  Sharlyn Deaner, PharmD []  Agnes Hose, PharmD []  Kendall Pauls, PharmD []  Gladstone Lamer, PharmD []  Armanda Bern, PharmD []  Tera Fellows, PharmD   Positive urine culture Treated with Cephalexin , organism sensitive to the same and no further patient follow-up is required at this time.  Delena Feil 07/01/2023, 9:07 AM

## 2023-08-02 NOTE — Progress Notes (Signed)
 Subjective   Patient ID:  [name] is a 28 y.o. (DOB 1995/04/09) female.     Patient presents with  . OfficeVisit    BC nexplanon, Heavy bleeding,  sometimes 2 cycles in a month,  irreg, Discuss other bc options      Not happy with Nexplanon and desires to use somehting more.   PMH adenomyosis.  Tried Depo and did not like, changed to Nexplanon.  Having BTB on Nexplanon.   Counseled on use of Mirena IUD.  Pt desires this.  Se/r/b/use outlined.  Irrg BTB outlined.  Would be good candidate for this.        Current Outpatient Medications on File Prior to Visit  Medication Sig Dispense Refill  . ACCU-CHEK SOFTCLIX LANCETS lancets 1 each by Other route 3 (three) times a day. 300 each 3  . acetone, urine, test (CHEK-STIX,KETOSTIX,KETOCARE) strip one strip by Does not apply route as needed. 25 each 0  . albuterol  sulfate HFA (PROVENTIL ,VENTOLIN ,PROAIR ) 108 (90 Base) MCG/ACT inhaler Inhale two puffs into the lungs every 4 (four) hours as needed for Wheezing or Shortness of Breath. 8 g 3  . BAQSIMI  TWO PACK 3 MG/DOSE POWD 3 mg by Nasal route as needed (for severe hypoglycemia). 1 each 2  . Biotin 800 MCG TABS Take by mouth daily.    . Continuous Glucose Receiver (DEXCOM G7 RECEIVER) DEVI 1 Device by Does not apply route daily. 1 each 0  . Continuous Glucose Sensor (DEXCOM G7 SENSOR) MISC USE TO CHECK BLOOD SUGAR CONTINUOUSLY. CHANGE EVERY 10 DAYS 3 each 11  . Continuous Glucose Transmitter (DEXCOM G6 TRANSMITTER) MISC Apply 1 transmitter as directed every 3 months. 1 each 3  . etonogestrel (NEXPLANON) [place] implant Inject one each into the skin.    . ferrous sulfate  325 (65 FE) MG tablet Take one tablet (325 mg dose) by mouth daily. (Patient not taking: Reported on 06/13/2023) 30 tablet 3  . gabapentin (NEURONTIN) 300 mg capsule Take one capsule (300 mg dose) by mouth with breakfast as needed. 90 capsule 3  . glucose blood (ACCU-CHEK GUIDE) test strip USE TO MONITOR BLOOD GLUCOSE  3 TIMES DAILY 300 each 3  . ibuprofen (ADVIL,MOTRIN) 800 mg tablet Take one tablet (800 mg dose) by mouth 3 (three) times a day for 10 days. 30 tablet 0  . insulin  aspart (NOVOLOG ) 100 UNIT/ML injection Tandem pump.  Basal 12a 0.9, 10a 0.75.  CF 40.  I:C 8.0.  Total:  60 units/day. 60 mL 0  . Insulin  Glargine (LANTUS  SOLOSTAR) 100 UNIT/ML SOPN Inject twenty Units into the skin daily. Use if not on insulin  pump. 15 mL 5  . Insulin  Pen Needle (BD PEN NEEDLE NANO 2ND GEN) 32G X 4 MM MISC one each by Does not apply route daily. 100 each 3  . linaclotide (LINZESS) 145 mcg capsule Take one capsule (145 mcg dose) by mouth daily. Please schedule office visit for future refills. 30 capsule 0  . Multiple Vitamins-Calcium (ONE-A-DAY WOMENS PO) Take 1 tablet by mouth daily.    . pantoprazole  sodium (PROTONIX ) 40 mg tablet Take one tablet (40 mg dose) by mouth 2 (two) times daily before meals. 0 60 tablet 0  . pravastatin sodium (PRAVACHOL) 40 mg tablet TAKE 1 TABLET(40 MG) BY MOUTH DAILY 90 tablet 1   No current facility-administered medications on file prior to visit.     Past Medical History:  Diagnosis Date  . Anemia   . Anxiety   . Asthma (*)  02/07/2011  . Autoimmune thyroiditis 04/13/2012   Normal TSH at [facility]  . Depression   . Diabetes mellitus (*)    type 1  . Genital herpes simplex 02/13/2011  . High risk pregnancy due to history of preterm labor in third trimester (*) 12/16/2017  . Infertility, female       Past Surgical History:  Procedure Laterality Date  . Dilation and curettage of uterus          Past Medical History, Past Surgery History, Allergies, Social History, and Family History were reviewed and updated.    Review of Systems is complete and negative except as noted.  Objective   BP 124/70   Wt 164 lb (74.4 kg)   LMP 07/11/2023   BMI 28.15 kg/m  General:  Well Developed, Well Nourished, No distress       Impression   1. Nexplanon in place   2. Adenomyosis    3. Encounter for contraceptive management, unspecified type   4. Irregular intermenstrual bleeding     Plan    Will keep nexplanon in for now.   Will set up appt for  Mirena IUD insertion and Nexplanon removal soon.         Patient's Medications       * Accurate as of August 02, 2023  1:30 PM. Reflects encounter med changes as of last refresh          Continued Medications      Instructions  ACCU-CHEK GUIDE test strip Generic drug: glucose blood  USE TO MONITOR BLOOD GLUCOSE 3 TIMES DAILY   ACCU-CHEK SOFTCLIX LANCETS lancets  1 each, Other, 3 times daily   acetone (urine) test strip Commonly known as: CHEK-STIX,KETOSTIX,KETOCARE  1 strip, Does not apply, As needed   albuterol  sulfate HFA 108 (90 Base) MCG/ACT inhaler Commonly known as: PROVENTIL ,VENTOLIN ,PROAIR   2 puffs, Inhalation, Every 4 hours as needed   BAQSIMI  TWO PACK 3 MG/DOSE Powd Generic drug: Glucagon   3 mg, Nasal, As needed   BD PEN NEEDLE NANO 2ND GEN 32G X 4 MM Misc Generic drug: Insulin  Pen Needle  1 each, Does not apply, Daily   Biotin 800 MCG Tabs  Daily   DEXCOM G6 TRANSMITTER Misc  Apply 1 transmitter as directed every 3 months.   DEXCOM G7 RECEIVER Devi  1 Device, Does not apply, Daily   DEXCOM G7 SENSOR Misc  USE TO CHECK BLOOD SUGAR CONTINUOUSLY. CHANGE EVERY 10 DAYS   ferrous sulfate  325 (65 FE) MG tablet  325 mg, Oral, Daily   gabapentin 300 mg capsule Commonly known as: NEURONTIN  300 mg, Oral, With breakfast as needed   ibuprofen 800 mg tablet Commonly known as: ADVIL,MOTRIN  800 mg, Oral, 3 times a day   insulin  aspart 100 UNIT/ML injection Commonly known as: NOVOLOG   Tandem pump.  Basal 12a 0.9, 10a 0.75.  CF 40.  I:C 8.0.  Total:  60 units/day.   LANTUS  SOLOSTAR 100 UNIT/ML Sopn  20 Units, Subcutaneous, Daily, Use if not on insulin  pump.   linaclotide 145 mcg capsule Commonly known as: LINZESS  145 mcg, Oral, Daily, Please schedule office visit for future  refills.   NEXPLANON [place] implant Generic drug: etonogestrel  1 each   ONE-A-DAY WOMENS PO  1 tablet, Daily   pantoprazole  sodium 40 mg tablet Commonly known as: PROTONIX   40 mg, Oral, Twice a day before meals, 0   pravastatin sodium 40 mg tablet Commonly known as: PRAVACHOL  40 mg, Oral, Daily  No orders of the defined types were placed in this encounter.   Risks, benefits, and alternatives of the medications and treatment plan prescribed today were discussed, and patient expressed understanding. Plan follow-up as discussed or as needed if any worsening symptoms or change in condition.    A yearly preventative health exam was recommended and current age based recommendations were discussed.     *Some images could not be shown.

## 2023-08-22 ENCOUNTER — Ambulatory Visit: Payer: Self-pay | Admitting: Surgery

## 2023-08-22 NOTE — H&P (Signed)
 History of Present Illness: [name] is a 28 y.o. female who is seen today as an office consultation for evaluation of new patient (Cholelithiasis with sludge gallstones )   For the last few months, she has been having epigastric abdominal pain and bloating. This usually gets worse after eating. She also has a lot of nausea. She had a CT scan on 5/13 that showed gallstones with no signs of cholecystitis. She also had a recent RUQ US  at [facility] that per report showed multiple gallstones. LFTs are normal. Lipase on 5/13 was mildly elevated at 108, however it appears this has been chronically elevated over the last few years.   She is a type 1 diabetic and uses an insulin  pump. She says she has been told she has gastroparesis and reports she will be seeing a new GI doctor soon. She has not had any prior abdominal surgeries.       Review of Systems: A complete review of systems was obtained from the patient.  I have reviewed this information and discussed as appropriate with the patient.  See HPI as well for other ROS.     Medical History: Past Medical History Past Medical History: Diagnosis Date  Anemia    Anxiety    Arrhythmia    Asthma, unspecified asthma severity, unspecified whether complicated, unspecified whether persistent (HHS-HCC)    Diabetes mellitus without complication (CMS/HHS-HCC)    GERD (gastroesophageal reflux disease)    Hyperlipidemia    Thyroid  disease         Problem List There is no problem list on file for this patient.     Past Surgical History History reviewed. No pertinent surgical history.     Allergies No Known Allergies    Medications Ordered Prior to Encounter Current Outpatient Medications on File Prior to Visit Medication Sig Dispense Refill  gabapentin (NEURONTIN) 300 MG capsule Take 300 mg by mouth 3 (three) times daily      insulin  NPH (HUMULIN N) injection (concentration 100 units/mL) Inject subcutaneously 2 (two)  times daily before meals      linaCLOtide (LINZESS) 145 mcg capsule Take 145 mcg by mouth once daily      pantoprazole  (PROTONIX ) 40 MG DR tablet Take 40 mg by mouth once daily      pravastatin (PRAVACHOL) 40 MG tablet Take 40 mg by mouth at bedtime        No current facility-administered medications on file prior to visit.      Family History Family History Problem Relation Age of Onset  High blood pressure (Hypertension) Mother    Diabetes Mother    Diabetes Father        Tobacco Use History Social History    Tobacco Use Smoking Status Some Days  Types: Cigarettes Smokeless Tobacco Current      Social History Social History    Socioeconomic History  Marital status: Single Tobacco Use  Smoking status: Some Days     Types: Cigarettes  Smokeless tobacco: Current Substance and Sexual Activity  Drug use: Never    Social Drivers of Manufacturing engineer Strain: Low Risk  (06/13/2023)   Received from [facility]   Overall Financial Resource Strain (CARDIA)    Difficulty of Paying Living Expenses: Not very hard Food Insecurity: No Food Insecurity (06/13/2023)   Received from [facility]   Hunger Vital Sign    Within the past 12 months, you worried that your food would run out before you  got the money to buy more.: Never true    Within the past 12 months, the food you bought just didn't last and you didn't have money to get more.: Never true Transportation Needs: No Transportation Needs (06/13/2023)   Received from [facility]   PRAPARE - Transportation    Lack of Transportation (Medical): No    Lack of Transportation (Non-Medical): No Physical Activity: Sufficiently Active (06/13/2023)   Received from [facility]   Exercise Vital Sign    On average, how many days per week do you engage in moderate to strenuous exercise (like a brisk walk)?: 4 days    On average, how many minutes do you engage in exercise at this level?: 60 min Stress: Stress  Concern Present (06/13/2023)   Received from [facility] - Occupational Stress Questionnaire    Feeling of Stress : To some extent Social Connections: Somewhat Isolated (06/13/2023)   Received from [facility]   Social Network    How would you rate your social network (family, work, friends)?: Restricted participation with some degree of social isolation Housing Stability: Low Risk  (06/13/2023)   Received from [facility] Stability Vital Sign    In the last 12 months, was there a time when you were not able to pay the mortgage or rent on time?: No    In the past 12 months, how many times have you moved where you were living?: 1    At any time in the past 12 months, were you homeless or living in a shelter (including now)?: No      Objective:   Vitals:   08/22/23 1010 BP: 126/80 Pulse: 82 Temp: 36.7 C (98 F) SpO2: 99% Weight: 74.9 kg (165 lb 3.2 oz) Height: 162.6 cm (5' 4) PainSc:   6 PainLoc: Abdomen   Body mass index is 28.36 kg/m.   Physical Exam Vitals reviewed.  Constitutional:      General: She is not in acute distress.    Appearance: Normal appearance.  HENT:     Head: Normocephalic and atraumatic.    Eyes:     General: No scleral icterus.    Conjunctiva/sclera: Conjunctivae normal.      Cardiovascular:     Rate and Rhythm: Normal rate and regular rhythm.     Heart sounds: No murmur heard. Pulmonary:     Effort: Pulmonary effort is normal. No respiratory distress.     Breath sounds: Normal breath sounds. No wheezing.  Abdominal:     General: There is no distension.     Palpations: Abdomen is soft.     Tenderness: There is no abdominal tenderness.    Skin:    General: Skin is warm and dry.    Neurological:     General: No focal deficit present.     Mental Status: She is alert and oriented to person, place, and time.          Labs, Imaging and Diagnostic Testing: CT abd/pelvis  06/28/23: IMPRESSION: Cholelithiasis with no acute cholecystitis.     Assessment and Plan:   Assessment Diagnoses and all orders for this visit:   Calculus of gallbladder with chronic cholecystitis without obstruction -     CCS Case Posting Request; Future     28 yo female with postprandial epigastric abdominal pain, bloating and nausea. I have personally reviewed her labs, imaging and referral notes. She may have a component of gastroparesis given her T1DM (  last documented A1c in Sept 2024 was 11, she has seen an endocrinologist), however some of her symptoms are consistent with biliary colic. Laparoscopic cholecystectomy was recommended. The details of this procedure were discussed with the patient, including the risks of bleeding, infection, post-cholecystectomy diarrhea, and <0.5% risk of common bile duct injury. The patient expressed understanding and agrees to proceed with surgery. She will be contacted to schedule an elective surgery date. All questions were answered.  Leonor Dawn, MD [facility] Surgery General, Hepatobiliary and Pancreatic Surgery 08/22/23 10:37 AM

## 2023-08-30 ENCOUNTER — Encounter: Admitting: Podiatry

## 2023-08-30 NOTE — Progress Notes (Signed)
Patient did not show for scheduled appointment today.

## 2023-08-30 NOTE — Pre-Procedure Instructions (Signed)
 Surgical Instructions   Your procedure is scheduled on September 05, 2023. Report to [facility] Main Entrance A at 5:30 A.M., then check in with the Admitting office. Any questions or running late day of surgery: call [phone]  Questions prior to your surgery date: call [phone], Monday-Friday, 8am-4pm. If you experience any cold or flu symptoms such as cough, fever, chills, shortness of breath, etc. between now and your scheduled surgery, please notify us  at the above number.     Remember:  Do not eat after midnight the night before your surgery   You may drink clear liquids until 4:30 AM the morning of your surgery.   Clear liquids allowed are: Water , Non-Citrus Juices (without pulp), Carbonated Beverages, Clear Tea (no milk, honey, etc.), Black Coffee Only (NO MILK, CREAM OR POWDERED CREAMER of any kind), and Gatorade.    Take these medicines the morning of surgery with A SIP OF WATER : linaclotide (LINZESS)  pantoprazole  (PROTONIX )  pravastatin (PRAVACHOL)    May take these medicines IF NEEDED: gabapentin (NEURONTIN)  ondansetron  (ZOFRAN -ODT)  Propylene Glycol (LUBRICANT EYE DROP) eye drops   One week prior to surgery, STOP taking any Aspirin  (unless otherwise instructed by your surgeon) Aleve , Naproxen , Ibuprofen, Motrin, Advil, Goody's, BC's, all herbal medications, fish oil, and non-prescription vitamins.   WHAT DO I DO ABOUT MY DIABETES MEDICATION?   Contact your PCP or Endocrinologist for insulin  pump instructions or decrease your basal rate by 20% at midnight prior to surgery.  If insulin  pump not in use, take your LANTUS  SOLOSTAR take 100% the morning before surgery and 16 units morning of surgery.   If your blood sugar is greater than 220 morning of surgery, take one half of your normal sliding scale dose of Insulin  Aspart.   HOW TO MANAGE YOUR DIABETES BEFORE AND AFTER SURGERY  Why is it important to control my blood sugar before and after  surgery? Improving blood sugar levels before and after surgery helps healing and can limit problems. A way of improving blood sugar control is eating a healthy diet by:  Eating less sugar and carbohydrates  Increasing activity/exercise  Talking with your doctor about reaching your blood sugar goals High blood sugars (greater than 180 mg/dL) can raise your risk of infections and slow your recovery, so you will need to focus on controlling your diabetes during the weeks before surgery. Make sure that the doctor who takes care of your diabetes knows about your planned surgery including the date and location.  How do I manage my blood sugar before surgery? Check your blood sugar at least 4 times a day, starting 2 days before surgery, to make sure that the level is not too high or low.  Check your blood sugar the morning of your surgery when you wake up and every 2 hours until you get to the Short Stay unit.  If your blood sugar is less than 70 mg/dL, you will need to treat for low blood sugar: Do not take insulin . Treat a low blood sugar (less than 70 mg/dL) with  cup of clear juice (cranberry or apple), 4 glucose tablets, OR glucose gel. Recheck blood sugar in 15 minutes after treatment (to make sure it is greater than 70 mg/dL). If your blood sugar is not greater than 70 mg/dL on recheck, call [phone] for further instructions. Report your blood sugar to the short stay nurse when you get to Short Stay.  If you are admitted to the hospital after surgery: Your blood sugar  will be checked by the staff and you will probably be given insulin  after surgery (instead of oral diabetes medicines) to make sure you have good blood sugar levels. The goal for blood sugar control after surgery is 80-180 mg/dL.                      Do NOT Smoke (Tobacco/Vaping) for 24 hours prior to your procedure.  If you use a CPAP at night, you may bring your mask/headgear for your overnight stay.   You will be  asked to remove any contacts, glasses, piercing's, hearing aid's, dentures/partials prior to surgery. Please bring cases for these items if needed.    Patients discharged the day of surgery will not be allowed to drive home, and someone needs to stay with them for 24 hours.  SURGICAL WAITING ROOM VISITATION Patients may have no more than 2 support people in the waiting area - these visitors may rotate.   Pre-op nurse will coordinate an appropriate time for 1 ADULT support person, who may not rotate, to accompany patient in pre-op.  Children under the age of 60 must have an adult with them who is not the patient and must remain in the main waiting area with an adult.  If the patient needs to stay at the hospital during part of their recovery, the visitor guidelines for inpatient rooms apply.  Please refer to the [facility] website for the visitor guidelines for any additional information.   If you received a COVID test during your pre-op visit  it is requested that you wear a mask when out in public, stay away from anyone that may not be feeling well and notify your surgeon if you develop symptoms. If you have been in contact with anyone that has tested positive in the last 10 days please notify you surgeon.      Pre-operative CHG Bathing Instructions   You can play a key role in reducing the risk of infection after surgery. Your skin needs to be as free of germs as possible. You can reduce the number of germs on your skin by washing with CHG (chlorhexidine  gluconate) soap before surgery. CHG is an antiseptic soap that kills germs and continues to kill germs even after washing.   DO NOT use if you have an allergy to chlorhexidine /CHG or antibacterial soaps. If your skin becomes reddened or irritated, stop using the CHG and notify one of our RNs at [phone].              TAKE A SHOWER THE NIGHT BEFORE SURGERY AND THE DAY OF SURGERY    Please keep in mind the following:  DO NOT shave,  including legs and underarms, 48 hours prior to surgery.   You may shave your face before/day of surgery.  Place clean sheets on your bed the night before surgery Use a clean washcloth (not used since being washed) for each shower. DO NOT sleep with pet's night before surgery.  CHG Shower Instructions:  Wash your face and private area with normal soap. If you choose to wash your hair, wash first with your normal shampoo.  After you use shampoo/soap, rinse your hair and body thoroughly to remove shampoo/soap residue.  Turn the water  OFF and apply half the bottle of CHG soap to a CLEAN washcloth.  Apply CHG soap ONLY FROM YOUR NECK DOWN TO YOUR TOES (washing for 3-5 minutes)  DO NOT use CHG soap on face, private areas, open wounds,  or sores.  Pay special attention to the area where your surgery is being performed.  If you are having back surgery, having someone wash your back for you may be helpful. Wait 2 minutes after CHG soap is applied, then you may rinse off the CHG soap.  Pat dry with a clean towel  Put on clean pajamas    Additional instructions for the day of surgery: DO NOT APPLY any lotions, deodorants, cologne, or perfumes.   Do not wear jewelry or makeup Do not wear nail polish, gel polish, artificial nails, or any other type of covering on natural nails (fingers and toes) Do not bring valuables to the hospital. [facility] is not responsible for valuables/personal belongings. Put on clean/comfortable clothes.  Please brush your teeth.  Ask your nurse before applying any prescription medications to the skin.

## 2023-08-31 ENCOUNTER — Encounter (HOSPITAL_COMMUNITY): Payer: Self-pay

## 2023-08-31 ENCOUNTER — Encounter (HOSPITAL_COMMUNITY)
Admission: RE | Admit: 2023-08-31 | Discharge: 2023-08-31 | Disposition: A | Discharge status: Home / Self Care | Source: Ambulatory Visit | Attending: Surgery | Admitting: Surgery

## 2023-08-31 ENCOUNTER — Other Ambulatory Visit: Payer: Self-pay

## 2023-08-31 VITALS — BP 125/86 | HR 79 | Temp 99.1°F | Resp 16 | Ht 64.0 in | Wt 167.4 lb

## 2023-08-31 DIAGNOSIS — K8010 Calculus of gallbladder with chronic cholecystitis without obstruction: Secondary | ICD-10-CM | POA: Diagnosis not present

## 2023-08-31 DIAGNOSIS — E109 Type 1 diabetes mellitus without complications: Secondary | ICD-10-CM | POA: Diagnosis not present

## 2023-08-31 DIAGNOSIS — Z01818 Encounter for other preprocedural examination: Secondary | ICD-10-CM | POA: Insufficient documentation

## 2023-08-31 HISTORY — DX: Depression, unspecified: F32.A

## 2023-08-31 HISTORY — DX: Myoneural disorder, unspecified: G70.9

## 2023-08-31 HISTORY — DX: Nontoxic diffuse goiter: E04.0

## 2023-08-31 HISTORY — DX: Anemia, unspecified: D64.9

## 2023-08-31 HISTORY — DX: Gastro-esophageal reflux disease without esophagitis: K21.9

## 2023-08-31 LAB — CBC
HCT: 34.2 % — ABNORMAL LOW (ref 36.0–46.0)
Hemoglobin: 10.7 g/dL — ABNORMAL LOW (ref 12.0–15.0)
MCH: 25.7 pg — ABNORMAL LOW (ref 26.0–34.0)
MCHC: 31.3 g/dL (ref 30.0–36.0)
MCV: 82.2 fL (ref 80.0–100.0)
Platelets: 406 K/uL — ABNORMAL HIGH (ref 150–400)
RBC: 4.16 MIL/uL (ref 3.87–5.11)
RDW: 14.1 % (ref 11.5–15.5)
WBC: 6.7 K/uL (ref 4.0–10.5)
nRBC: 0 % (ref 0.0–0.2)

## 2023-08-31 LAB — HEMOGLOBIN A1C
Hgb A1c MFr Bld: 11.7 % — ABNORMAL HIGH (ref 4.8–5.6)
Mean Plasma Glucose: 289.09 mg/dL

## 2023-08-31 LAB — BASIC METABOLIC PANEL WITH GFR
Anion gap: 9 (ref 5–15)
BUN: 11 mg/dL (ref 6–20)
CO2: 25 mmol/L (ref 22–32)
Calcium: 8.6 mg/dL — ABNORMAL LOW (ref 8.9–10.3)
Chloride: 103 mmol/L (ref 98–111)
Creatinine, Ser: 0.72 mg/dL (ref 0.44–1.00)
GFR, Estimated: >60 mL/min (ref >60)
Glucose, Bld: 190 mg/dL — ABNORMAL HIGH (ref 70–99)
Potassium: 3.8 mmol/L (ref 3.5–5.1)
Sodium: 137 mmol/L (ref 135–145)

## 2023-08-31 LAB — GLUCOSE, CAPILLARY: Glucose-Capillary: 180 mg/dL — ABNORMAL HIGH (ref 70–99)

## 2023-08-31 NOTE — Progress Notes (Addendum)
 PCP - Emily Jama Fear, NP Cardiologist - Denies Endocrinologist - Dr. Donnice Lipps - last office visit 10/19/2022  PPM/ICD - Denies Device Orders - n/a Rep Notified - n/a  Chest x-ray - n/a EKG - 08/31/2023 Stress Test - Denies ECHO - Denies Cardiac Cath - Denies  Sleep Study - Denies CPAP - n/a  Pt is DM1. She checks her blood sugars every 4 hours when not wearing Dexcom. Normal fasting range in 120-210. CBG at pre-op appointment 180. A1c result pending. Pt states she takes the Lantus  in the morning if she isn't wearing her insulin  pump. Pt instructed to decreased basal rates by 20% at midnight if she wears pump to surgery (preferred method). If not wearing pump, she will take 100% of Lantus  the morning before surgery and 80% the morning of surgery  Last dose of GLP1 agonist- n/a GLP1 instructions: n/a  Blood Thinner Instructions: n/a Aspirin  Instructions: n/a  ERAS Protcol - Clear liquids until 0430 morning of surgery PRE-SURGERY Ensure or G2- n/a  COVID TEST- n/a   Anesthesia review: Yes. Type 1 DM with insulin  pump and long acting insulin . Abnormal hgb A1c 11.7  Patient denies shortness of breath, fever, cough and chest pain at PAT appointment. Pt denies any respiratory illness/infection in the last two months.   All instructions explained to the patient, with a verbal understanding of the material. Patient agrees to go over the instructions while at home for a better understanding. Patient also instructed to self quarantine after being tested for COVID-19. The opportunity to ask questions was provided.

## 2023-09-01 ENCOUNTER — Encounter (HOSPITAL_COMMUNITY): Payer: Self-pay

## 2023-09-01 NOTE — Progress Notes (Signed)
 Anesthesia Chart Review:  Case: [id number] Date/Time: 09/05/23 0715   Procedure: LAPAROSCOPIC CHOLECYSTECTOMY   Anesthesia type: General   Diagnosis: Calculus of gallbladder with chronic cholecystitis without obstruction [K80.10]   Pre-op diagnosis: GALLSTONES   [facility] OR ROOM 08 / [facility] OR   Surgeons: Dasie Leonor CROME, MD       DISCUSSION: Patient is a 28 year old female scheduled for the above procedure.   History includes smoking, DM1, neuropathy, lupus, asthma, GERD, IDA, learning disability (not specified), anxiety. Goiter is listed, but 2023 thyroid  US  indicated she had a Normal nonenlarged thyroid .  A1c resulted at 11.7% (previously 11.6% on 10/19/22 and 11.9% on 07/08/22 at [facility]). She has a Dexcom G7 CGM. She reported checking CBGs Q 4 hours when not wearing her Dexcom. She has a Tandem T:Slim insulin  pump (insulin  Aspart 100 units/mL), but takes AM Lantus  (100 units/mL) when she is not wearing her pump. She reported fasting glucose levels typically ~ 120-210. Message sent to Dr. Dasie regarding A1c result, but she noted in her initial evaluatoin that patient had known A1c results in the 11% range.  She will get a glucose and urine pregnancy test on arrival. She has a Nexplanon implant. Anesthesia team to evaluate on the day of surgery.     VS: BP 125/86   Pulse 79   Temp 37.3 C   Resp 16   Ht 5' 4 (1.626 m)   Wt 75.9 kg   LMP 08/16/2023 (Approximate)   SpO2 99%   BMI 28.73 kg/m   PROVIDERS: Jerome Heron Ruth, PA-C is PCP  Beryl Cough, MD is endocrinologist, last visit 10/19/22. Celestia Harder, NP is GI provider. Evaluation on 06/13/23 for GERD.  LABS: Preoperative labs noted. See DISCUSSION. (all labs ordered are listed, but only abnormal results are displayed)  Labs Reviewed  GLUCOSE, CAPILLARY - Abnormal; Notable for the following components:      Result Value   Glucose-Capillary 180 (*)    All other components within normal limits  HEMOGLOBIN A1C -  Abnormal; Notable for the following components:   Hgb A1c MFr Bld 11.7 (*)    All other components within normal limits  BASIC METABOLIC PANEL WITH GFR - Abnormal; Notable for the following components:   Glucose, Bld 190 (*)    Calcium 8.6 (*)    All other components within normal limits  CBC - Abnormal; Notable for the following components:   Hemoglobin 10.7 (*)    HCT 34.2 (*)    MCH 25.7 (*)    Platelets 406 (*)    All other components within normal limits     IMAGES: CT Abd/pelvis 06/28/23: IMPRESSION: Cholelithiasis with no acute cholecystitis.  CT Chest/abd/pelvis 10/22/22 ([facility]): 1. No acute posttraumatic sequelae in the chest, abdomen or pelvis.  2. Small amount of hyperdensity seen throughout the bilateral renal  collecting systems and ureters. Additionally, the bladder is  distended and filled with mildly hyperdense fluid. This appears  symmetric and may be related to excreted contrast. Correlate  clinically for hematuria.  3. 3.3 cm left ovarian simple-appearing cyst. No follow-up imaging  recommended.  4. Trace free fluid in the pelvis, likely physiologic.  5. Cholelithiasis.   NM Gastric Emptying Study 12/07/21 ([facility]): IMPRESSION: Normal gastric emptying.    US  Thyroid  07/26/21: IMPRESSION: 1. Normal nonenlarged thyroid . 2. Single sub-1 cm RIGHT thyroid  nodule which does not meet TI-RADS criteria for biopsy nor follow-up. - The above is in keeping with the ACR TI-RADS  recommendations - J Am Coll Radiol 2017;14:587-595.   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   Urinary tract infection    Vision abnormalities    wears glasses    Past Surgical History:  Procedure Laterality Date   NO PAST SURGERIES      MEDICATIONS:  Blood Gluc Meter Disp-Strips (BLOOD GLUCOSE METER DISPOSABLE) DEVI   etonogestrel (NEXPLANON) 68 MG IMPL implant   gabapentin (NEURONTIN) 300 MG capsule   Insulin  Aspart PenFill 100 UNIT/ML SOCT   Insulin  Human (INSULIN  PUMP) SOLN   Insulin  Pen Needle (PEN NEEDLES 3/16) 31G X 5 MM MISC   Insulin  Pen Needle 31G X 5 MM MISC   LANTUS  SOLOSTAR 100 UNIT/ML Solostar Pen   linaclotide (LINZESS) 145 MCG CAPS capsule   ondansetron  (ZOFRAN -ODT) 8 MG disintegrating tablet   pantoprazole  (PROTONIX ) 40 MG tablet   pravastatin (PRAVACHOL) 40 MG tablet   Propylene Glycol (LUBRICANT EYE DROP) 0.6 % SOLN   No current facility-administered medications for this encounter.    Isaiah Ruder, PA-C Surgical Short Stay/Anesthesiology [facility] Phone [phone] [facility] Phone [phone] 09/01/2023 9:54 AM

## 2023-09-01 NOTE — Anesthesia Preprocedure Evaluation (Signed)
 Anesthesia Evaluation    Airway        Dental   Pulmonary Current Smoker          Cardiovascular      Neuro/Psych    GI/Hepatic   Endo/Other  diabetes    Renal/GU      Musculoskeletal   Abdominal   Peds  Hematology   Anesthesia Other Findings   Reproductive/Obstetrics                              Anesthesia Physical Anesthesia Plan  ASA:   Anesthesia Plan:    Post-op Pain Management:    Induction:   PONV Risk Score and Plan:   Airway Management Planned:   Additional Equipment:   Intra-op Plan:   Post-operative Plan:   Informed Consent:   Plan Discussed with:   Anesthesia Plan Comments: (PAT note written 09/01/2023 by Siddhant Hashemi, PA-C.  )        Anesthesia Quick Evaluation

## 2023-09-05 ENCOUNTER — Ambulatory Visit (HOSPITAL_COMMUNITY): Admission: RE | Admit: 2023-09-05 | Source: Home / Self Care | Admitting: Surgery

## 2023-09-05 ENCOUNTER — Encounter (HOSPITAL_COMMUNITY): Admission: RE | Payer: Self-pay | Source: Home / Self Care

## 2023-09-05 ENCOUNTER — Encounter (HOSPITAL_COMMUNITY): Payer: Self-pay | Admitting: Vascular Surgery

## 2023-09-05 SURGERY — LAPAROSCOPIC CHOLECYSTECTOMY
Anesthesia: General

## 2023-09-19 ENCOUNTER — Ambulatory Visit: Admitting: Podiatry

## 2023-09-26 ENCOUNTER — Encounter (INDEPENDENT_AMBULATORY_CARE_PROVIDER_SITE_OTHER): Admitting: Podiatry

## 2023-09-26 DIAGNOSIS — Z91199 Patient's noncompliance with other medical treatment and regimen due to unspecified reason: Secondary | ICD-10-CM

## 2023-09-26 NOTE — Progress Notes (Signed)
 Patient was a no-show for her scheduled appointment today.

## 2023-12-30 ENCOUNTER — Other Ambulatory Visit (HOSPITAL_COMMUNITY): Payer: Self-pay

## 2023-12-30 ENCOUNTER — Encounter (HOSPITAL_COMMUNITY): Payer: Self-pay

## 2023-12-30 ENCOUNTER — Ambulatory Visit (HOSPITAL_COMMUNITY): Admission: EM | Admit: 2023-12-30 | Discharge: 2023-12-30 | Disposition: A | Discharge status: Home / Self Care

## 2023-12-30 ENCOUNTER — Ambulatory Visit (INDEPENDENT_AMBULATORY_CARE_PROVIDER_SITE_OTHER)

## 2023-12-30 ENCOUNTER — Ambulatory Visit (HOSPITAL_COMMUNITY): Payer: Self-pay | Admitting: Physician Assistant

## 2023-12-30 DIAGNOSIS — R051 Acute cough: Secondary | ICD-10-CM

## 2023-12-30 DIAGNOSIS — M940 Chondrocostal junction syndrome [Tietze]: Secondary | ICD-10-CM | POA: Diagnosis not present

## 2023-12-30 DIAGNOSIS — J069 Acute upper respiratory infection, unspecified: Secondary | ICD-10-CM

## 2023-12-30 LAB — POC SOFIA SARS ANTIGEN FIA: SARS Coronavirus 2 Ag: NEGATIVE

## 2023-12-30 LAB — POCT INFLUENZA A/B
Influenza A, POC: NEGATIVE
Influenza B, POC: NEGATIVE

## 2023-12-30 MED ORDER — IBUPROFEN 600 MG PO TABS
600.0000 mg | ORAL_TABLET | Freq: Three times a day (TID) | ORAL | 0 refills | Status: AC | PRN
  Filled 2023-12-30: qty 30, 10d supply, fill #0

## 2023-12-30 MED ORDER — ALBUTEROL SULFATE HFA 108 (90 BASE) MCG/ACT IN AERS
1.0000 | INHALATION_SPRAY | Freq: Four times a day (QID) | RESPIRATORY_TRACT | 0 refills | Status: AC | PRN
  Filled 2023-12-30: qty 6.7, 25d supply, fill #0

## 2023-12-30 MED ORDER — PROMETHAZINE-DM 6.25-15 MG/5ML PO SYRP
5.0000 mL | ORAL_SOLUTION | Freq: Three times a day (TID) | ORAL | 0 refills | Status: AC | PRN
  Filled 2023-12-30: qty 118, 8d supply, fill #0

## 2023-12-30 NOTE — ED Provider Notes (Signed)
 [facility]    CSN: [id number] Arrival date & time: 12/30/23  9096      History   Chief Complaint Chief Complaint  Patient presents with   Cough    HPI [name] is a 28 y.o. female.   Patient presents today with a 2-day history of URI symptoms.  She reports nasal congestion, rib pain with coughing, significant cough, headache, malaise.  Denies any shortness of breath, nausea, vomiting, weakness.  She has been taking over-the-counter cold and flu medication without improvement of symptoms.  She has never had COVID.  She has not had the flu recently.  She does have a remote history of asthma but does not require regular use of medication and denies hospitalization related to this condition.  She does have a history of type 1 diabetes and reports that her blood sugars have been somewhat elevated since she started getting sick.  She is comfort that she is not pregnant.  She does smoke occasionally but less than half a pack per day.  Denies any recent antibiotics or steroids.    Past Medical History:  Diagnosis Date   Adenomyosis    Allergy    dust    Anemia    Iron Deficiency   Anxiety    Asthma    Depression    Diabetes mellitus    Insulin  Dependant   Diffuse goiter    Normal nonenlarged thyroid  by 07/26/21 US    GERD (gastroesophageal reflux disease)    Learning disability    Lupus    Neuromuscular disorder (HCC)    neuropathy in hands   Urinary tract infection    Vision abnormalities    wears glasses    Patient Active Problem List   Diagnosis Date Noted   Diabetic ketoacidosis without coma associated with type 1 diabetes mellitus (HCC) 06/15/2022   Normocytic anemia 06/15/2022   Pseudohyponatremia 06/15/2022   Hypophosphatemia 07/28/2021   Acute pancreatitis without infection or necrosis 07/27/2021   Thyroid  nodule 07/27/2021   Hypokalemia 07/27/2021   Diabetic ketoacidosis in type 1 diabetic  07/26/2021   GERD (gastroesophageal reflux  disease) 07/26/2021   Hyperlipidemia 07/26/2021   Elevated lipase 07/26/2021   AKI (acute kidney injury) 07/26/2021   Nausea and vomiting 07/26/2021   DKA, type 1, not at goal [facility]) 05/01/2017   Acute respiratory failure with hypoxia (HCC) 08/13/2012   Thyroiditis, autoimmune 04/13/2012   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 (HCC) 04/13/2012   Arrhythmia 04/13/2012   Depression 04/13/2012   Diabetic peripheral neuropathy (HCC) 04/13/2012   Diabetic hyperosmolar non-ketotic state (HCC) 04/04/2012   Hyponatremia 04/04/2012   Trichomoniasis 03/23/2012   Hydradenitis 03/23/2012   Short-term memory loss 04/30/2011   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      Home Medications    Prior to Admission medications   Medication Sig Start Date End Date Taking? Authorizing  Provider  albuterol  (VENTOLIN  HFA) 108 (90 Base) MCG/ACT inhaler Inhale 1-2 puffs into the lungs every 6 (six) hours as needed for wheezing or shortness of breath. 12/30/23  Yes Jyaire Koudelka K, PA-C  ibuprofen (ADVIL) 600 MG tablet Take 1 tablet (600 mg total) by mouth every 8 (eight) hours as needed. 12/30/23  Yes Tersa Fotopoulos K, PA-C  promethazine-dextromethorphan (PROMETHAZINE-DM) 6.25-15 MG/5ML syrup Take 5 mLs by mouth 3 (three) times daily as needed for cough. 12/30/23  Yes Shigeo Baugh K, PA-C  ACCRUFER 30 MG CAPS Take 1 capsule by mouth 2 (two) times daily.     [provider]  Blood Gluc Meter Disp-Strips (BLOOD GLUCOSE METER DISPOSABLE) DEVI Use as directed 07/30/13   Krishnan, Sendil K, MD  etonogestrel (NEXPLANON) 68 MG IMPL implant 1 each by Subdermal route once.    [provider]  hydrOXYzine (ATARAX) 25 MG tablet Take 25 mg by mouth as needed.    [provider]  Insulin  Aspart PenFill 100 UNIT/ML SOCT by Pump Prime route as directed. USE VIA INSULIN  PUMP AS DIRECTED UP TO 60 UNITS PER DAY    [provider]  Insulin  Human (INSULIN  PUMP) SOLN Inject into the skin. Use in insulin  pump as directed 60 units per day    [provider]  Insulin  Pen Needle (PEN NEEDLES 3/16) 31G X 5 MM MISC Use as directed 07/30/13   Krishnan, Sendil K, MD  Insulin  Pen Needle 31G X 5 MM MISC Use with insulin  pen 08/17/12   Brennan, Michael J, MD  LANTUS  SOLOSTAR 100 UNIT/ML Solostar Pen Inject into the skin daily as needed (blood sugar regulation (when pump not in use)).    [provider]  linaclotide (LINZESS) 145 MCG CAPS capsule Take 145 mcg by mouth daily before breakfast.    [provider]  ondansetron  (ZOFRAN -ODT) 8 MG disintegrating tablet Take 1 tablet (8 mg total) by mouth every 8 (eight) hours as needed for nausea or vomiting. 06/28/23   Randol Simmonds, MD  pantoprazole  (PROTONIX ) 40 MG tablet Take 40 mg by mouth 2 (two) times daily before a meal.    [provider]  pravastatin (PRAVACHOL) 80 MG tablet Take 80 mg by mouth daily.    [provider]  pregabalin (LYRICA) 50 MG capsule Take 50 mg by mouth 3 (three) times daily.    [provider]  Vitamin D, Ergocalciferol, 50000 units CAPS Take by mouth once a week.    [provider]    Family History Family History  Adopted: Yes  Problem Relation Age of Onset   Early death Sister    Asthma Maternal Aunt    Depression Maternal Aunt    Mental illness Maternal Aunt    Asthma Maternal Grandmother    Thyroid  disease  Maternal Grandmother    Obesity Maternal Grandmother    Heart disease Neg Hx    Diabetes Neg Hx     Social History Social History   Tobacco Use   Smoking status: Some Days    Current packs/day: 0.50    Average packs/day: 0.5 packs/day for 10.9 years (5.4 ttl pk-yrs)    Types: Cigarettes    Start date: 2015   Smokeless tobacco: Never  Vaping Use   Vaping status: Never Used  Substance Use Topics   Alcohol use: No   Drug use: No     Allergies   Patient has no known allergies.   Review of Systems Review of Systems  Constitutional:  Positive for activity change. Negative for appetite change, fatigue and fever.  HENT:  Positive for congestion and sore throat. Negative for sinus pressure and sneezing.   Respiratory:  Positive for cough. Negative for shortness of breath.   Cardiovascular:  Negative for chest pain.  Gastrointestinal:  Negative for abdominal pain, diarrhea, nausea and vomiting.  Musculoskeletal:  Negative for arthralgias and myalgias.  Neurological:  Positive for headaches. Negative for dizziness and light-headedness.     Physical Exam Triage Vital Signs ED Triage Vitals  Encounter Vitals Group     BP 12/30/23 0936 (!) 144/94     Girls Systolic BP Percentile --      Girls Diastolic BP Percentile --      Boys Systolic BP Percentile --      Boys Diastolic BP Percentile --      Pulse Rate 12/30/23 0936 84     Resp 12/30/23 0936 16     Temp 12/30/23 0936 99.2 F (37.3 C)     Temp Source 12/30/23 0936 Oral     SpO2 12/30/23 0936 97 %     Weight --      Height --      Head Circumference --      Peak Flow --      Pain Score 12/30/23 0931 8     Pain Loc --      Pain Education --      Exclude from Growth Chart --    No data found.  Updated Vital Signs BP (!) 144/94 (BP Location: Right Arm)   Pulse 84   Temp 99.2 F (37.3 C) (Oral)   Resp 16   LMP 12/18/2023 (Approximate)   SpO2 97%   Visual Acuity Right Eye Distance:   Left Eye Distance:    Bilateral Distance:    Right Eye Near:   Left Eye Near:    Bilateral Near:     Physical Exam Vitals reviewed.  Constitutional:      General: She is awake. She is not in acute distress.    Appearance: Normal appearance. She is well-developed. She is not ill-appearing.     Comments: Very pleasant female appears stated age in no acute distress sitting comfortably in exam room  HENT:     Head: Normocephalic and atraumatic.     Right Ear: Tympanic membrane, ear canal and external ear normal. Tympanic membrane is not erythematous or bulging.     Left Ear: Tympanic membrane, ear canal and external ear normal. Tympanic membrane is not erythematous or bulging.     Nose:     Right Sinus: No maxillary sinus tenderness or frontal sinus tenderness.     Left Sinus: No maxillary sinus tenderness or frontal sinus tenderness.     Mouth/Throat:     Pharynx: Uvula midline. No oropharyngeal exudate or posterior oropharyngeal erythema.  Cardiovascular:     Rate and Rhythm: Normal rate and regular rhythm.     Heart sounds: Normal heart sounds, S1 normal and S2 normal. No murmur heard. Pulmonary:     Effort: Pulmonary effort is normal.     Breath sounds: Normal breath sounds. No wheezing, rhonchi or rales.     Comments: Clear to auscultation bilaterally Chest:     Chest wall: Tenderness present. No deformity or swelling.     Comments: Reproducible tenderness with palpation of inferior anterior rib cage. Psychiatric:        Behavior: Behavior is cooperative.      UC Treatments / Results  Labs (all labs ordered are listed, but only abnormal results are displayed) Labs Reviewed  POC SOFIA SARS ANTIGEN FIA  POCT INFLUENZA A/B    EKG   Radiology DG Chest 2 View Result Date: 12/30/2023 CLINICAL DATA:  Productive cough. EXAM: CHEST - 2 VIEW COMPARISON:  10/25/2022 FINDINGS: The heart size and mediastinal contours are within normal limits. Both lungs are clear. The visualized skeletal  structures are unremarkable. IMPRESSION: No active cardiopulmonary disease. Electronically Signed   By: Camellia Candle M.D.   On: 12/30/2023 11:19    Procedures Procedures (including critical care time)  Medications Ordered in UC Medications - No data to display  Initial Impression / Assessment and Plan / UC Course  I have reviewed the triage vital signs and the nursing notes.  Pertinent labs & imaging results that were available during my care of the patient were reviewed by me and considered in my medical decision making (see chart for details).     Patient is well-appearing, afebrile, nontoxic, nontachycardic.  No evidence of acute infection with physicalexam that warrant initiation of antibiotics.  She tested negative for COVID and flu.  I believe that her rib pain is related to costochondritis given her clinical presentation and reproducible pain but I did obtain an x-ray that showed no acute cardiopulmonary disease.  Will start ibuprofen for pain relief.  We discussed that she should not combine this with additional NSAIDs due to risk of GI bleeding.  No indication for dose adjustment based on metabolic panel from 08/31/2023 with creatinine of 0.72 and Creatinine Clearance of 137 mL/Min.  She was given albuterol  as well as Promethazine DM.  We discussed that promethazine DM can be sedating and she is not to drive drink alcohol taking it.  She does have a history of type 1 diabetes and we discussed that illness can often trigger hyperglycemia so she should monitor her blood sugar regularly and get ketone strips to look for ketosis.  Discussed that if she has any signs of DKA including weakness, nausea/vomiting, shortness of breath, persistent positive ketones in her urine she will need to go to the emergency room.  We discussed that if she is not feeling better within a few days or if she has any worsening symptoms she needs to be seen immediately.  Strict return precautions given.  Excuse note  provided.  All questions answered to patient satisfaction.  Final Clinical Impressions(s) / UC Diagnoses   Final diagnoses:  Acute cough  Viral URI with cough  Costochondritis     Discharge Instructions      You tested negative for COVID/flu.  I do not see anything on your chest x-ray but we will contact you if the radiologist sees something that I did not that changes our treatment plan.  Use Promethazine DM for cough.  This to make you sleepy so do not drive drink alcohol while taking it.  Take ibuprofen for pain.  Do not combine this with NSAIDs including aspirin , ibuprofen/Advil, naproxen /Aleve  because this can cause stomach bleeding.  You can use Tylenol /acetaminophen .  Use albuterol  before discussed hours as needed for shortness of breath.  If your symptoms are not improving within a few days or if anything worsens and you have high fever, worsening cough, shortness of breath, increasing pain, nausea/vomiting, weakness you need to be seen immediately.  Monitor your blood sugar closely and obtain ketone strips from the pharmacy.  If your blood sugar is difficult to control, you have significant ketones in your  urine, you develop shortness of breath, nausea, vomiting you should go to the emergency room     ED Prescriptions     Medication Sig Dispense Auth. Provider   albuterol  (VENTOLIN  HFA) 108 (90 Base) MCG/ACT inhaler Inhale 1-2 puffs into the lungs every 6 (six) hours as needed for wheezing or shortness of breath. 6.7 g Sienna Stonehocker K, PA-C   promethazine-dextromethorphan (PROMETHAZINE-DM) 6.25-15 MG/5ML syrup Take 5 mLs by mouth 3 (three) times daily as needed for cough. 118 mL Monnie Gudgel K, PA-C   ibuprofen (ADVIL) 600 MG tablet Take 1 tablet (600 mg total) by mouth every 8 (eight) hours as needed. 30 tablet Birdie Beveridge K, PA-C      PDMP not reviewed this encounter.   Sherrell Rocky POUR, PA-C 12/30/23 1154

## 2023-12-30 NOTE — ED Triage Notes (Signed)
 Patient here today with c/o productive cough, nasal congestion, rib soreness, and headaches X 3 days. Denies taking anything for symptoms. No known sick contacts. No recent travel.

## 2023-12-30 NOTE — Discharge Instructions (Signed)
 You tested negative for COVID/flu.  I do not see anything on your chest x-ray but we will contact you if the radiologist sees something that I did not that changes our treatment plan.  Use Promethazine DM for cough.  This to make you sleepy so do not drive drink alcohol while taking it.  Take ibuprofen for pain.  Do not combine this with NSAIDs including aspirin , ibuprofen/Advil, naproxen /Aleve  because this can cause stomach bleeding.  You can use Tylenol /acetaminophen .  Use albuterol  before discussed hours as needed for shortness of breath.  If your symptoms are not improving within a few days or if anything worsens and you have high fever, worsening cough, shortness of breath, increasing pain, nausea/vomiting, weakness you need to be seen immediately.  Monitor your blood sugar closely and obtain ketone strips from the pharmacy.  If your blood sugar is difficult to control, you have significant ketones in your urine, you develop shortness of breath, nausea, vomiting you should go to the emergency room

## 2024-01-25 NOTE — Progress Notes (Signed)
 History of Present Illness This is a 28 y.o. female who returns for follow-up of type 1 diabetes.  First visit since September 2024.  [name] again cites her caregiver burden for her daughter with special needs as her biggest healthcare barrier.  She admits to not staying accountable with her diabetes management.  She stopped using Dexcom G7 sensors earlier this year when she had several sensor failures.  She has been on and off he Tandem t:slim insulin  pump and otherwise has been doing Lantus  and Novolog  injections.  However, she doesn't always take Lantus  and isn't consistent with her carbohydrate counting and Novolog  dosing.  Finger stick blood sugars 1-3 times a day vary from 60s to high 300s.  Hgb A1C today is 11.5%.  Past Medical History:  Diagnosis Date   Anemia    Anxiety    Asthma (*) 02/07/2011   Autoimmune thyroiditis 04/13/2012   Normal TSH at [facility]   Depression    Diabetes mellitus (*)    type 1   Genital herpes simplex 02/13/2011   High risk pregnancy due to history of preterm labor in third trimester (*) 12/16/2017   Infertility, female    Past Surgical History:  Procedure Laterality Date   Dilation and curettage of uterus     Allergies[1] Medications Ordered Prior to Encounter[2] Family History[3] Social History[4]  Review of Systems Eight systems were reviewed; pertinent positives and negatives are as mentioned in the HPI.    Physical Examination Vitals:   01/25/24 0911  BP: 123/72  Weight: 176 lb (79.8 kg)  Height: 5' 4 (1.626 m)   Body mass index is 30.21 kg/m. GENERAL:  Pleasant, well-appearing female in no distress. HEENT: Pupils equal, round and reactive to light. Extraocular movements intact. Nondilated fundoscopic exam reveals no signs of retinopathy. Mucous membranes moist with no oropharyngeal lesions. NECK: Supple with no lymphadenopathy or thyromegaly. CARDIOVASCULAR: Normal rate and regular rhythm with no murmurs, rubs or gallops; 2+ distal  pulses. EXTREMITIES: Warm and well-perfused.  No clubbing, cyanosis or edema. NEUROLOGIC: Alert and oriented x3. Gait and station normal. SKIN: Warm and dry, with no rashes. No acanthosis nigricans. Diabetic foot exam:  Left: Monofilament test: Sensation normal  Pulses: normal and present  Skin: Normal and no erythema, no cyanosis or pallor   Other findings: none Right: Monofilament test: Sensation normal  Pulses: normal and present  Skin: Normal and no erythema, no cyanosis or pallor   Other findings: none Exam performed with shoes and socks removed.   Recent Results (from the past week)  POCT A1C   Collection Time: 01/25/24  9:15 AM  Result Value Ref Range   Hemoglobin A1c 11.5 (A) 4.8 - 5.6 %    A/P:   1.  Type 1 Diabetes:  Hgb A1C 11.5% today.  First visit since September 2024.  [name] again cites her caregiver burden for her daughter with special needs as her biggest healthcare barrier.  She admits to not staying accountable with her diabetes management.  She stopped using Dexcom G7 sensors earlier this year when she had several sensor failures.  She has been on and off he Tandem t:slim insulin  pump and otherwise has been doing Lantus  and Novolog  injections.  However, she doesn't always take Lantus  and isn't consistent with her carbohydrate counting and Novolog  dosing.  Finger stick blood sugars 1-3 times a day vary from 60s to high 300s.  [name] remains high risk for short-term and long-term complications from her diabetes.  She needs significant psychosocial  support in order to focus on her own health.  I placed a referral to the [facility] Social [facility] to see if there are any available support programs (last talked in May 2024). I did strongly encourage resuming he Tandem t:slim pump and Dexcom G7 sensors.  Wearing a pump and keeping it in Control IQ mode (hybrid closed loop) would greatly decrease her management burden and match requirements better.  No dose or setting  changes were recommended at today's visit.  Baseline labs were drawn today.  2.  Hyperlipidemia:  Now taking Pravastatin 80mg  daily.  Will check lipids today.  Follow-up in 3 months.     Donnice FORBES Lipps, MD 01/25/2024, 10:45 AM        [1] No Known Allergies [2] Current Outpatient Medications on File Prior to Visit  Medication Sig Dispense Refill   ACCU-CHEK SOFTCLIX LANCETS lancets 1 each by Other route 3 (three) times a day. 300 each 3   albuterol  sulfate HFA (PROVENTIL ,VENTOLIN ,PROAIR ) 108 (90 Base) MCG/ACT inhaler Inhale two puffs into the lungs every 4 (four) hours as needed for Wheezing or Shortness of Breath. 8 g 3   Biotin 800 MCG TABS Take by mouth daily.     etonogestrel (NEXPLANON) [place] implant Inject one each into the skin.     ferrous sulfate  325 (65 FE) MG tablet Take one tablet (325 mg dose) by mouth daily. 30 tablet 3   glucose blood (ACCU-CHEK GUIDE) test strip USE TO MONITOR BLOOD GLUCOSE 3 TIMES DAILY 300 each 3   ibuprofen  (ADVIL ,MOTRIN ) 800 mg tablet Take one tablet (800 mg dose) by mouth 3 (three) times a day for 10 days. 30 tablet 0   linaclotide (LINZESS) 145 mcg capsule Take one capsule (145 mcg dose) by mouth daily. Please schedule office visit for future refills. 30 capsule 0   Multiple Vitamins-Calcium (ONE-A-DAY WOMENS PO) Take 1 tablet by mouth daily.     pantoprazole  sodium (PROTONIX ) 40 mg tablet TAKE 1 TABLET BY MOUTH TWICE DAILY BEFORE MEALS 180 tablet 1   pravastatin (PRAVACHOL) 80 MG tablet Take one tablet (80 mg dose) by mouth every evening. 90 tablet 3   No current facility-administered medications on file prior to visit.  [3] Family History Problem Relation Name Age of Onset   Arthritis Mother     Autism Neg Hx     Birth defects Neg Hx     Breast cancer Neg Hx     Canavan disease Neg Hx     Chromosomal disorder Neg Hx     Colon cancer Neg Hx     Congenital heart disease Neg Hx     Cystic fibrosis Neg Hx     Diabetes  Neg Hx     Down syndrome Neg Hx     Familial dysautonomia Neg Hx     Fragile X syndrome Neg Hx     Graves' disease Mother     Hemophilia Neg Hx     Huntington's disease Neg Hx     Hypertension Neg Hx     Intellectual Disability  Neg Hx     Lupus Mother     Muscular dystrophy Neg Hx     Ovarian cancer Neg Hx     PKU Neg Hx     Spina bifida Neg Hx     Tay-Sachs disease Neg Hx    [4] Social History Socioeconomic History   Marital status: Single  Occupational History   Occupation: disabled  Tobacco Use  Smoking status: Former    Current packs/day: 0.25    Average packs/day: 0.3 packs/day for 6.7 years (1.7 ttl pk-yrs)    Types: Cigarettes    Start date: 05/16/2017    Passive exposure: Past   Smokeless tobacco: Never  Vaping Use   Vaping status: Never Used  Substance and Sexual Activity   Alcohol use: Not Currently   Drug use: Not Currently   Sexual activity: Yes    Partners: Male    Birth control/protection: None

## 2024-01-28 ENCOUNTER — Ambulatory Visit (HOSPITAL_COMMUNITY)
Admission: EM | Admit: 2024-01-28 | Discharge: 2024-01-28 | Disposition: A | Discharge status: Home / Self Care | Attending: Nurse Practitioner | Admitting: Nurse Practitioner

## 2024-01-28 ENCOUNTER — Encounter (HOSPITAL_COMMUNITY): Payer: Self-pay

## 2024-01-28 DIAGNOSIS — R09A2 Foreign body sensation, throat: Secondary | ICD-10-CM | POA: Diagnosis not present

## 2024-01-28 DIAGNOSIS — R1310 Dysphagia, unspecified: Secondary | ICD-10-CM | POA: Diagnosis not present

## 2024-01-28 DIAGNOSIS — M542 Cervicalgia: Secondary | ICD-10-CM

## 2024-01-28 DIAGNOSIS — Z8639 Personal history of other endocrine, nutritional and metabolic disease: Secondary | ICD-10-CM | POA: Diagnosis not present

## 2024-01-28 LAB — T4, FREE: Free T4: 0.81 ng/dL (ref 0.61–1.12)

## 2024-01-28 LAB — TSH: TSH: 1.57 u[IU]/mL (ref 0.350–4.500)

## 2024-01-28 NOTE — Discharge Instructions (Addendum)
 You were seen today for throat discomfort and trouble swallowing, which feels like food is getting stuck. Given your history of a thyroid  nodule, as well as acid reflux, your symptoms may be related to irritation from reflux, changes in swallowing, or less likely a thyroid  issue. Your prior thyroid  imaging showed a normal-sized thyroid  with a small nodule, but updated testing is needed to better understand your current symptoms.  A thyroid  blood panel has been ordered, and you should follow up with your endocrinologist to review the results and discuss whether repeat imaging or further evaluation is needed. Continue taking Protonix  as prescribed for acid reflux. To help with symptoms, eat soft foods cut into small pieces, chew slowly, take small sips of liquids, avoid alcohol and caffeine , and stay upright for at least 30 minutes after eating.  Follow up with your primary care provider or endocrinologist if symptoms continue, worsen, or interfere with eating. You may also need evaluation by a gastroenterology specialist if swallowing problems persist. Go to the emergency room right away if you develop true choking, inability to swallow liquids, drooling, shortness of breath, voice changes, chest pain, fever, or any sudden or severe worsening of symptoms.

## 2024-01-28 NOTE — ED Provider Notes (Signed)
 [facility]    CSN: [id number] Arrival date & time: 01/28/24  1138      History   Chief Complaint No chief complaint on file.   HPI [name] is a 28 y.o. female.   Discussed the use of AI scribe software for clinical note transcription with the patient, who gave verbal consent to proceed.   The patient presents with intermittent throat pain and a sensation of choking when swallowing, which she describes as feeling like food is getting stuck. The pain is localized to the left side of the throat only. She reports that these symptoms occur off and on and typically coincide with periods when she feels her thyroid  is enlarged. Her history is significant for a goiter diagnosed in 2013 and a known right thyroid  nodule identified on ultrasound in June 2023.  The patient also has a history of acid reflux, which has improved with Protonix  use. She denies any true choking episodes but states the sensation feels similar. She denies voice changes, neck pain, shortness of breath, fever, or recent illness. She is able to tolerate both liquids and solids but reports difficulty with swallowing. The throat pain remains isolated to the left side. She has not tried any treatments for the current symptoms.  She reports ongoing care with an endocrinologist but states that no specific treatment for her thyroid  has been discussed. Her most recent endocrinology visit was on January 25, 2024, during which poorly controlled diabetes was addressed, with an A1C of 11.5%.  The following sections of the patient's history were reviewed and updated as appropriate: allergies, current medications, past family history, past medical history, past social history, past surgical history, and problem list.      Past Medical History:  Diagnosis Date   Adenomyosis    Allergy    dust    Anemia    Iron Deficiency   Anxiety    Asthma    Depression    Diabetes mellitus    Insulin  Dependant   Diffuse  goiter    Normal nonenlarged thyroid  by 07/26/21 US    GERD (gastroesophageal reflux disease)    Learning disability    Lupus    Neuromuscular disorder (HCC)    neuropathy in hands   Urinary tract infection    Vision abnormalities    wears glasses    Patient Active Problem List   Diagnosis Date Noted   Diabetic ketoacidosis without coma associated with type 1 diabetes mellitus (HCC) 06/15/2022   Normocytic anemia 06/15/2022   Pseudohyponatremia 06/15/2022   Hypophosphatemia 07/28/2021   Acute pancreatitis without infection or necrosis 07/27/2021   Thyroid  nodule 07/27/2021   Hypokalemia 07/27/2021   Diabetic ketoacidosis in type 1 diabetic  07/26/2021   GERD (gastroesophageal reflux disease) 07/26/2021   Hyperlipidemia 07/26/2021   Elevated lipase 07/26/2021   AKI (acute kidney injury) 07/26/2021   Nausea and vomiting 07/26/2021   DKA, type 1, not at goal [facility]) 05/01/2017   Acute respiratory failure with hypoxia (HCC) 08/13/2012   Thyroiditis, autoimmune 04/13/2012   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) 03/24/2011   Insulin  resistance 03/24/2011   Acanthosis 03/24/2011  Goiter/hx of thyroiditis 03/24/2011   Xerosis cutis 02/25/2011   Eczema 02/24/2011   Learning disability    Herpes genitalis 02/13/2011   Bladder spasm 02/07/2011   Bipolar disorder (HCC) 02/07/2011    Class: Chronic   Altered mental status 02/07/2011   Asthma 02/07/2011    Class: Chronic    Past Surgical History:  Procedure Laterality Date   NO PAST SURGERIES      OB History   No obstetric history on  file.      Home Medications    Prior to Admission medications  Medication Sig Start Date End Date Taking? Authorizing Provider  ACCRUFER 30 MG CAPS Take 1 capsule by mouth 2 (two) times daily.    [provider]  albuterol  (VENTOLIN  HFA) 108 (90 Base) MCG/ACT inhaler Inhale 1-2 puffs into the lungs every 6 (six) hours as needed for wheezing or shortness of breath. 12/30/23   Raspet, Erin K, PA-C  Blood Gluc Meter Disp-Strips (BLOOD GLUCOSE METER DISPOSABLE) DEVI Use as directed 07/30/13   Krishnan, Sendil K, MD  etonogestrel (NEXPLANON) 68 MG IMPL implant 1 each by Subdermal route once.    [provider]  hydrOXYzine (ATARAX) 25 MG tablet Take 25 mg by mouth as needed.    [provider]  ibuprofen  (ADVIL ) 600 MG tablet Take 1 tablet (600 mg total) by mouth every 8 (eight) hours as needed. 12/30/23   Raspet, Rocky POUR, PA-C  Insulin  Aspart PenFill 100 UNIT/ML SOCT by Pump Prime route as directed. USE VIA INSULIN  PUMP AS DIRECTED UP TO 60 UNITS PER DAY    [provider]  Insulin  Human (INSULIN  PUMP) SOLN Inject into the skin. Use in insulin  pump as directed 60 units per day    [provider]  Insulin  Pen Needle (PEN NEEDLES 3/16) 31G X 5 MM MISC Use as directed 07/30/13   Krishnan, Sendil K, MD  Insulin  Pen Needle 31G X 5 MM MISC Use with insulin  pen 08/17/12   Hershal Ozell PARAS, MD  LANTUS  SOLOSTAR 100 UNIT/ML Solostar Pen Inject into the skin daily as needed (blood sugar regulation (when pump not in use)).    [provider]  linaclotide (LINZESS) 145 MCG CAPS capsule Take 145 mcg by mouth daily before breakfast.    [provider]  ondansetron  (ZOFRAN -ODT) 8 MG disintegrating tablet Take 1 tablet (8 mg total) by mouth every 8 (eight) hours as needed for nausea or vomiting. 06/28/23   Randol Simmonds, MD  pantoprazole  (PROTONIX ) 40 MG tablet Take 40 mg by mouth 2 (two) times daily before a meal.    [provider]  pravastatin  (PRAVACHOL) 80 MG tablet Take 80 mg by mouth daily.    [provider]  pregabalin (LYRICA) 50 MG capsule Take 50 mg by mouth 3 (three) times daily.    [provider]  promethazine -dextromethorphan (PROMETHAZINE -DM) 6.25-15 MG/5ML syrup Take 5 mLs by mouth 3 (three) times daily as needed for cough. 12/30/23   Raspet, Erin K, PA-C  Vitamin D, Ergocalciferol, 50000 units CAPS Take by mouth once a week.    [provider]    Family History Family History  Adopted: Yes  Problem Relation Age of Onset   Early death Sister    Asthma Maternal Aunt    Depression Maternal Aunt    Mental illness Maternal Aunt    Asthma Maternal Grandmother    Thyroid  disease Maternal Grandmother    Obesity Maternal Grandmother    Heart disease Neg  Hx    Diabetes Neg Hx     Social History Social History[1]   Allergies   Patient has no known allergies.   Review of Systems Review of Systems  Constitutional:  Negative for fever.  HENT:  Positive for sore throat (left side only) and trouble swallowing (solids and liquids).   Respiratory:  Negative for choking (not choking but it feels like it when swallowing foods).   Musculoskeletal:  Negative for neck pain and neck stiffness.  All other systems reviewed and are negative.    Physical Exam Triage Vital Signs ED Triage Vitals [01/28/24 1223]  Encounter Vitals Group     BP (!) 134/91     Girls Systolic BP Percentile      Girls Diastolic BP Percentile      Boys Systolic BP Percentile      Boys Diastolic BP Percentile      Pulse Rate 92     Resp 16     Temp 98 F (36.7 C)     Temp Source Oral     SpO2 98 %     Weight      Height      Head Circumference      Peak Flow      Pain Score 8     Pain Loc      Pain Education      Exclude from Growth Chart    No data found.  Updated Vital Signs BP (!) 134/91 (BP Location: Right Arm)   Pulse 92   Temp 98 F (36.7 C) (Oral)   Resp 16   LMP 12/18/2023 (Approximate)    SpO2 98%   Visual Acuity Right Eye Distance:   Left Eye Distance:   Bilateral Distance:    Right Eye Near:   Left Eye Near:    Bilateral Near:     Physical Exam Vitals reviewed.  Constitutional:      General: She is awake. She is not in acute distress.    Appearance: Normal appearance. She is well-developed. She is not ill-appearing, toxic-appearing or diaphoretic.  HENT:     Head: Normocephalic.     Right Ear: Hearing normal.     Left Ear: Hearing normal.     Nose: Nose normal.     Mouth/Throat:     Mouth: Mucous membranes are moist.     Pharynx: Oropharynx is clear. Uvula midline. No pharyngeal swelling, posterior oropharyngeal erythema or uvula swelling.  Eyes:     General: Vision grossly intact.     Conjunctiva/sclera: Conjunctivae normal.  Neck:     Thyroid : No thyroid  mass, thyromegaly or thyroid  tenderness.     Trachea: Trachea and phonation normal.     Comments: Patient able to manage oral secretions without difficulty. Speech is clear and easy to understand.  Cardiovascular:     Rate and Rhythm: Normal rate and regular rhythm.     Heart sounds: Normal heart sounds.  Pulmonary:     Effort: Pulmonary effort is normal.     Breath sounds: Normal breath sounds and air entry. No stridor.     Comments: Respirations even and unlabored  Musculoskeletal:        General: Normal range of motion.     Cervical back: Full passive range of motion without pain, normal range of motion and neck supple.  Lymphadenopathy:     Cervical: No cervical adenopathy.  Skin:    General: Skin is warm and dry.  Neurological:  General: No focal deficit present.     Mental Status: She is alert and oriented to person, place, and time.     Cranial Nerves: No dysarthria.     Sensory: Sensation is intact.     Motor: Motor function is intact.  Psychiatric:        Speech: Speech normal.        Behavior: Behavior is cooperative.      UC Treatments / Results  Labs (all labs ordered  are listed, but only abnormal results are displayed) Labs Reviewed  TSH  T4, FREE  T3 UPTAKE  T3, FREE    EKG   Radiology No results found.  Procedures Procedures (including critical care time)  Medications Ordered in UC Medications - No data to display  Initial Impression / Assessment and Plan / UC Course  I have reviewed the triage vital signs and the nursing notes.  Pertinent labs & imaging results that were available during my care of the patient were reviewed by me and considered in my medical decision making (see chart for details).     The patient presents with intermittent dysphagia and left-sided throat pain associated with a sensation of food sticking during swallowing, in the setting of a prior goiter and a known right thyroid  nodule. She is able to tolerate both liquids and solids and denies true choking, voice changes, shortness of breath, fever, or recent illness. Prior thyroid  ultrasound from June 2023 demonstrated a normal-sized, non-enlarged thyroid  with a single 1 cm right thyroid  nodule that did not meet criteria for biopsy, and prior thyroid  function testing was normal. Given the current symptoms and history, the presentation is not clearly explained by prior imaging findings, and the differential includes thyroid -related pathology, gastroesophageal reflux disease, and possible upper gastrointestinal or esophageal causes. Physical exam today unremarkable. Thyroid  appears normal in size. No tenderness, neck swelling, obvious swallowing problems.   Management includes ordering updated thyroid  function testing and recommending follow-up with her endocrinologist, Donnice Lipps, for further thyroid  evaluation and consideration of repeat thyroid  imaging if clinically indicated. She was advised to continue Protonix  for reflux management and to follow dietary modifications such as eating soft foods in smaller portions, avoiding alcohol and caffeine , and remaining upright after  meals. Consideration for upper endoscopy through endocrinology or gastroenterology was discussed if symptoms persist or worsen. Her diabetes remains poorly controlled with a recent A1C of 11.5, and she was advised to continue current diabetes management and follow closely with endocrinology.  The patient was advised to follow up with her primary care provider or endocrinologist for ongoing evaluation. Emergency evaluation is recommended for worsening difficulty swallowing, inability to tolerate liquids or solids, drooling, voice changes, shortness of breath, chest pain, fever, or any new or concerning symptoms.  Today's evaluation has revealed no signs of a dangerous process. Discussed diagnosis with patient and/or guardian. Patient and/or guardian aware of their diagnosis, possible red flag symptoms to watch out for and need for close follow up. Patient and/or guardian understands verbal and written discharge instructions. Patient and/or guardian comfortable with plan and disposition.  Patient and/or guardian has a clear mental status at this time, good insight into illness (after discussion and teaching) and has clear judgment to make decisions regarding their care  Documentation was completed with the aid of voice recognition software. Transcription may contain typographical errors.  Final Clinical Impressions(s) / UC Diagnoses   Final diagnoses:  Neck pain on left side  Globus pharyngeus  Dysphagia, unspecified type  History  of thyroid  nodule     Discharge Instructions      You were seen today for throat discomfort and trouble swallowing, which feels like food is getting stuck. Given your history of a thyroid  nodule, as well as acid reflux, your symptoms may be related to irritation from reflux, changes in swallowing, or less likely a thyroid  issue. Your prior thyroid  imaging showed a normal-sized thyroid  with a small nodule, but updated testing is needed to better understand your current  symptoms.  A thyroid  blood panel has been ordered, and you should follow up with your endocrinologist to review the results and discuss whether repeat imaging or further evaluation is needed. Continue taking Protonix  as prescribed for acid reflux. To help with symptoms, eat soft foods cut into small pieces, chew slowly, take small sips of liquids, avoid alcohol and caffeine , and stay upright for at least 30 minutes after eating.  Follow up with your primary care provider or endocrinologist if symptoms continue, worsen, or interfere with eating. You may also need evaluation by a gastroenterology specialist if swallowing problems persist. Go to the emergency room right away if you develop true choking, inability to swallow liquids, drooling, shortness of breath, voice changes, chest pain, fever, or any sudden or severe worsening of symptoms.     ED Prescriptions   None    PDMP not reviewed this encounter.     [1]  Social History Tobacco Use   Smoking status: Former    Current packs/day: 0.50    Average packs/day: 0.5 packs/day for 10.9 years (5.5 ttl pk-yrs)    Types: Cigarettes    Start date: 2015   Smokeless tobacco: Never  Vaping Use   Vaping status: Never Used  Substance Use Topics   Alcohol use: No   Drug use: No     Iola Lukes, FNP 01/28/24 1432

## 2024-01-28 NOTE — ED Triage Notes (Signed)
 Patient reports that she is having trouble swallowing and pain when she  swallows. Patient states, when I have problems with my thyroid  this occurs. Patient states she was diagnosed with a goiter in 2013, but is on no thyroid  medications.

## 2024-01-30 ENCOUNTER — Ambulatory Visit (HOSPITAL_COMMUNITY): Payer: Self-pay

## 2024-01-30 LAB — T3 UPTAKE: T3 Uptake Ratio: 28 % (ref 24–39)

## 2024-01-30 LAB — T3, FREE: T3, Free: 2.4 pg/mL (ref 2.0–4.4)

## 2024-01-30 NOTE — Progress Notes (Deleted)
 01/30/2024 [name] [id number] [birth year]   CHIEF COMPLAINT:   HISTORY OF PRESENT ILLNESS: [name] is a 28 year old female with a past medical history of anxiety, depression, asthma, diabetes, learning disability, Lupus and GERD. She presents to our office today as referred by Christopher Bohr, NP for further evaluation regarding iron deficiency anemia and acid reflux.     Discussed the use of AI scribe software for clinical note transcription with the patient, who gave verbal consent to proceed.  History of Present Illness     Latest Ref Rng & Units 08/31/2023   10:30 AM 06/28/2023    2:10 PM 06/16/2022    6:07 AM  CBC  WBC 4.0 - 10.5 K/uL 6.7  7.3  6.7   Hemoglobin 12.0 - 15.0 g/dL 89.2  87.7  89.9   Hematocrit 36.0 - 46.0 % 34.2  39.7  32.3   Platelets 150 - 400 K/uL 406  374  364        Latest Ref Rng & Units 08/31/2023   10:30 AM 06/28/2023    2:10 PM 06/17/2022    3:38 AM  CMP  Glucose 70 - 99 mg/dL 809  651  88   BUN 6 - 20 mg/dL 11  18  6    Creatinine 0.44 - 1.00 mg/dL 9.27  8.96  9.37   Sodium 135 - 145 mmol/L 137  133  137   Potassium 3.5 - 5.1 mmol/L 3.8  4.2  3.7   Chloride 98 - 111 mmol/L 103  98  105   CO2 22 - 32 mmol/L 25  21  24    Calcium 8.9 - 10.3 mg/dL 8.6  9.8  8.8   Total Protein 6.5 - 8.1 g/dL  9.5    Total Bilirubin 0.0 - 1.2 mg/dL  0.7    Alkaline Phos 38 - 126 U/L  79    AST 15 - 41 U/L  21    ALT 0 - 44 U/L  14        Past Medical History:  Diagnosis Date   Adenomyosis    Allergy    dust    Anemia    Iron Deficiency   Anxiety    Asthma    Depression    Diabetes mellitus    Insulin  Dependant   Diffuse goiter    Normal nonenlarged thyroid  by 07/26/21 US    GERD (gastroesophageal reflux disease)    Learning disability    Lupus    Neuromuscular disorder (HCC)    neuropathy in hands   Urinary tract infection    Vision abnormalities    wears glasses   Past Surgical History:  Procedure Laterality Date   NO PAST  SURGERIES     Social History:  Family History:    reports that she has quit smoking. Her smoking use included cigarettes. She started smoking about 10 years ago. She has a 5.5 pack-year smoking history. She has never used smokeless tobacco. She reports that she does not drink alcohol and does not use drugs. family history includes Asthma in her maternal aunt and maternal grandmother; Depression in her maternal aunt; Early death in her sister; Mental illness in her maternal aunt; Obesity in her maternal grandmother; Thyroid  disease in her maternal grandmother. She was adopted. Allergies[1]    Outpatient Encounter Medications as of 01/31/2024  Medication Sig   ACCRUFER 30 MG CAPS Take 1 capsule by mouth 2 (two) times daily.   albuterol  (VENTOLIN   HFA) 108 (90 Base) MCG/ACT inhaler Inhale 1-2 puffs into the lungs every 6 (six) hours as needed for wheezing or shortness of breath.   Blood Gluc Meter Disp-Strips (BLOOD GLUCOSE METER DISPOSABLE) DEVI Use as directed   etonogestrel (NEXPLANON) 68 MG IMPL implant 1 each by Subdermal route once.   hydrOXYzine (ATARAX) 25 MG tablet Take 25 mg by mouth as needed.   ibuprofen  (ADVIL ) 600 MG tablet Take 1 tablet (600 mg total) by mouth every 8 (eight) hours as needed.   Insulin  Aspart PenFill 100 UNIT/ML SOCT by Pump Prime route as directed. USE VIA INSULIN  PUMP AS DIRECTED UP TO 60 UNITS PER DAY   Insulin  Human (INSULIN  PUMP) SOLN Inject into the skin. Use in insulin  pump as directed 60 units per day   Insulin  Pen Needle (PEN NEEDLES 3/16) 31G X 5 MM MISC Use as directed   Insulin  Pen Needle 31G X 5 MM MISC Use with insulin  pen   LANTUS  SOLOSTAR 100 UNIT/ML Solostar Pen Inject into the skin daily as needed (blood sugar regulation (when pump not in use)).   linaclotide (LINZESS) 145 MCG CAPS capsule Take 145 mcg by mouth daily before breakfast.   ondansetron  (ZOFRAN -ODT) 8 MG disintegrating tablet Take 1 tablet (8 mg total) by mouth every 8 (eight) hours  as needed for nausea or vomiting.   pantoprazole  (PROTONIX ) 40 MG tablet Take 40 mg by mouth 2 (two) times daily before a meal.   pravastatin (PRAVACHOL) 80 MG tablet Take 80 mg by mouth daily.   pregabalin (LYRICA) 50 MG capsule Take 50 mg by mouth 3 (three) times daily.   promethazine -dextromethorphan (PROMETHAZINE -DM) 6.25-15 MG/5ML syrup Take 5 mLs by mouth 3 (three) times daily as needed for cough.   Vitamin D, Ergocalciferol, 50000 units CAPS Take by mouth once a week.   No facility-administered encounter medications on file as of 01/31/2024.     REVIEW OF SYSTEMS:  Gen: Denies fever, sweats or chills. No weight loss.  CV: Denies chest pain, palpitations or edema. Resp: Denies cough, shortness of breath of hemoptysis.  GI: Denies heartburn, dysphagia, stomach or lower abdominal pain. No diarrhea or constipation.  GU: Denies urinary burning, blood in urine, increased urinary frequency or incontinence. MS: Denies joint pain, muscles aches or weakness. Derm: Denies rash, itchiness, skin lesions or unhealing ulcers. Psych: Denies depression, anxiety, memory loss or confusion. Heme: Denies bruising, easy bleeding. Neuro:  Denies headaches, dizziness or paresthesias. Endo:  Denies any problems with DM, thyroid  or adrenal function.  PHYSICAL EXAM: There were no vitals taken for this visit. General: in no acute distress. Head: Normocephalic and atraumatic. Eyes:  Sclerae non-icteric, conjunctive pink. Ears: Normal auditory acuity. Mouth: Dentition intact. No ulcers or lesions.  Neck: Supple, no lymphadenopathy or thyromegaly.  Lungs: Clear bilaterally to auscultation without wheezes, crackles or rhonchi. Heart: Regular rate and rhythm. No murmur, rub or gallop appreciated.  Abdomen: Soft, nontender, nondistended. No masses. No hepatosplenomegaly. Normoactive bowel sounds x 4 quadrants.  Rectal:  Musculoskeletal: Symmetrical with no gross deformities. Skin: Warm and dry. No rash or  lesions on visible extremities. Extremities: No edema. Neurological: Alert oriented x 4, no focal deficits.  Psychological: Alert and cooperative. Normal mood and affect.  ASSESSMENT AND PLAN:    CC:  Jerome Heron Ruth, PA-C       [1] No Known Allergies

## 2024-01-31 ENCOUNTER — Ambulatory Visit: Admitting: Nurse Practitioner

## 2024-02-01 ENCOUNTER — Telehealth: Payer: Self-pay

## 2024-02-01 NOTE — Telephone Encounter (Signed)
 [facility] said that patient cancelled her EGD twice. She never got it done there

## 2024-02-10 ENCOUNTER — Encounter (HOSPITAL_COMMUNITY): Payer: Self-pay | Admitting: Emergency Medicine

## 2024-02-10 ENCOUNTER — Other Ambulatory Visit: Payer: Self-pay

## 2024-02-10 ENCOUNTER — Emergency Department (HOSPITAL_COMMUNITY)
Admission: EM | Admit: 2024-02-10 | Discharge: 2024-02-10 | Disposition: A | Discharge status: Home / Self Care | Attending: Emergency Medicine | Admitting: Emergency Medicine

## 2024-02-10 DIAGNOSIS — J029 Acute pharyngitis, unspecified: Secondary | ICD-10-CM | POA: Diagnosis not present

## 2024-02-10 DIAGNOSIS — R509 Fever, unspecified: Secondary | ICD-10-CM

## 2024-02-10 DIAGNOSIS — Z794 Long term (current) use of insulin: Secondary | ICD-10-CM | POA: Insufficient documentation

## 2024-02-10 DIAGNOSIS — J45909 Unspecified asthma, uncomplicated: Secondary | ICD-10-CM | POA: Insufficient documentation

## 2024-02-10 DIAGNOSIS — E1065 Type 1 diabetes mellitus with hyperglycemia: Secondary | ICD-10-CM | POA: Insufficient documentation

## 2024-02-10 LAB — COMPREHENSIVE METABOLIC PANEL WITH GFR
ALT: 7 U/L (ref 0–44)
AST: 23 U/L (ref 15–41)
Albumin: 4.1 g/dL (ref 3.5–5.0)
Alkaline Phosphatase: 57 U/L (ref 38–126)
Anion gap: 10 (ref 5–15)
BUN: 9 mg/dL (ref 6–20)
CO2: 23 mmol/L (ref 22–32)
Calcium: 9.2 mg/dL (ref 8.9–10.3)
Chloride: 102 mmol/L (ref 98–111)
Creatinine, Ser: 0.86 mg/dL (ref 0.44–1.00)
GFR, Estimated: >60 mL/min
Glucose, Bld: 214 mg/dL — ABNORMAL HIGH (ref 70–99)
Potassium: 3.9 mmol/L (ref 3.5–5.1)
Sodium: 135 mmol/L (ref 135–145)
Total Bilirubin: 0.3 mg/dL (ref 0.0–1.2)
Total Protein: 7.8 g/dL (ref 6.5–8.1)

## 2024-02-10 LAB — CBC WITH DIFFERENTIAL/PLATELET
Abs Immature Granulocytes: 0.03 K/uL (ref 0.00–0.07)
Basophils Absolute: 0 K/uL (ref 0.0–0.1)
Basophils Relative: 0 %
Eosinophils Absolute: 0.1 K/uL (ref 0.0–0.5)
Eosinophils Relative: 1 %
HCT: 31.2 % — ABNORMAL LOW (ref 36.0–46.0)
Hemoglobin: 9.7 g/dL — ABNORMAL LOW (ref 12.0–15.0)
Immature Granulocytes: 0 %
Lymphocytes Relative: 6 %
Lymphs Abs: 0.5 K/uL — ABNORMAL LOW (ref 0.7–4.0)
MCH: 25 pg — ABNORMAL LOW (ref 26.0–34.0)
MCHC: 31.1 g/dL (ref 30.0–36.0)
MCV: 80.4 fL (ref 80.0–100.0)
Monocytes Absolute: 0.6 K/uL (ref 0.1–1.0)
Monocytes Relative: 7 %
Neutro Abs: 7.3 K/uL (ref 1.7–7.7)
Neutrophils Relative %: 86 %
Platelets: 383 K/uL (ref 150–400)
RBC: 3.88 MIL/uL (ref 3.87–5.11)
RDW: 15 % (ref 11.5–15.5)
WBC: 8.5 K/uL (ref 4.0–10.5)
nRBC: 0.2 % (ref 0.0–0.2)

## 2024-02-10 LAB — RESP PANEL BY RT-PCR (RSV, FLU A&B, COVID)  RVPGX2
Influenza A by PCR: NEGATIVE
Influenza B by PCR: NEGATIVE
Resp Syncytial Virus by PCR: NEGATIVE
SARS Coronavirus 2 by RT PCR: NEGATIVE

## 2024-02-10 LAB — GROUP A STREP BY PCR: Group A Strep by PCR: NOT DETECTED

## 2024-02-10 IMAGING — CT CT ABD-PELV W/O CM
2 of 4 series · 16 of 46 positions shown, 18 images · non-contrast
Comparison: None Available.

CLINICAL DATA: Pancreatitis, nausea and vomiting onset this
morning, abdominal pain



[Series 3: ap without · axial · non-contrast · 0.63mm/px · z∈[+981,+1406]mm · 13 of 97 slices shown, 15 images]
[im 6/97  soft-tissue]
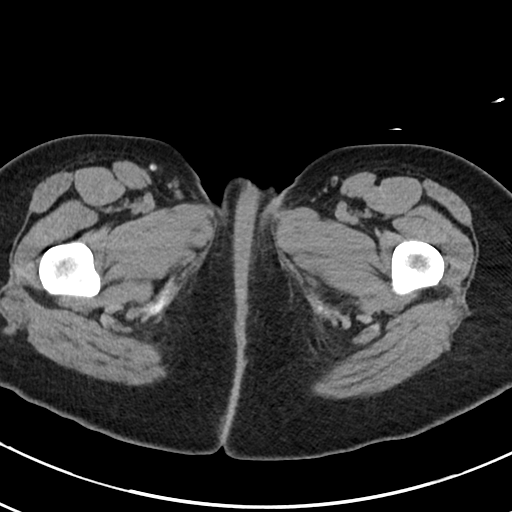
[im 6/97  bone]
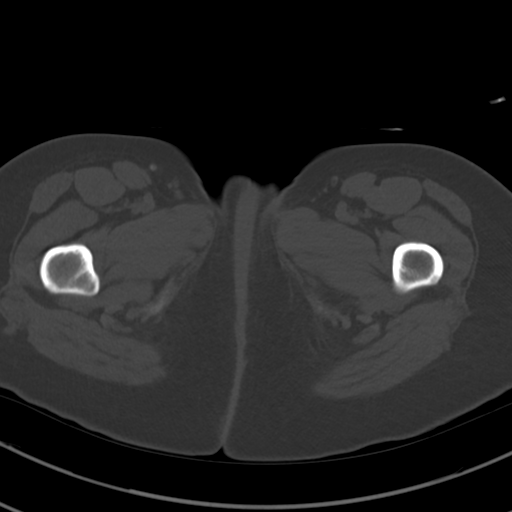
[im 16/97  soft-tissue]
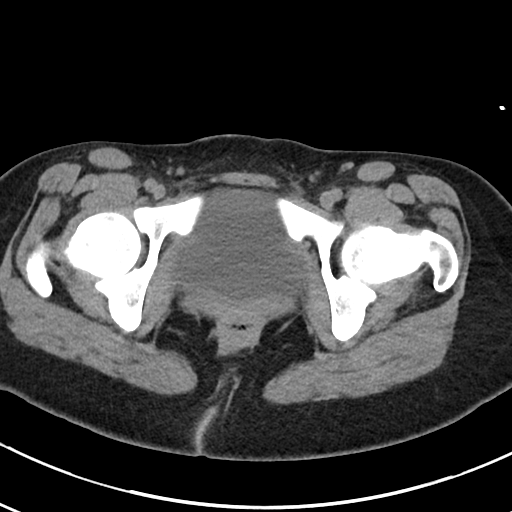
[im 21/97  soft-tissue]
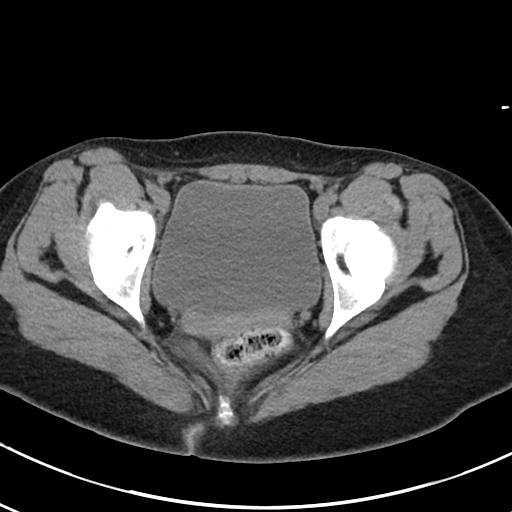
[im 26/97  soft-tissue]
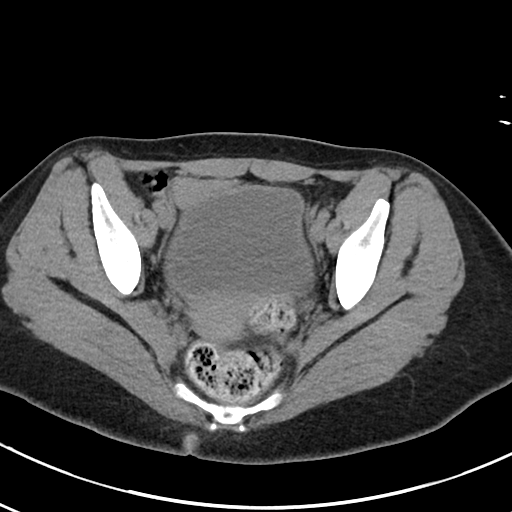
[im 36/97  soft-tissue]
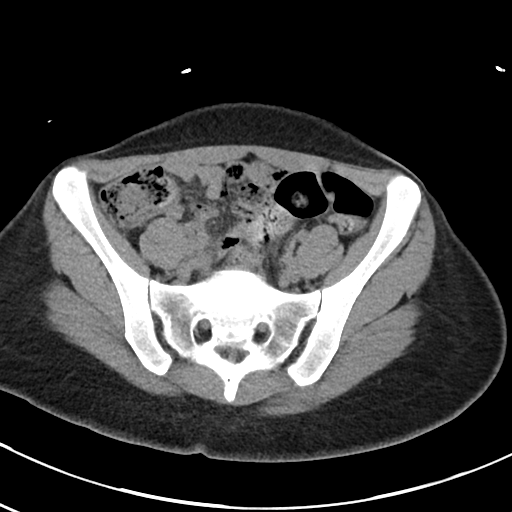
[im 41/97  soft-tissue]
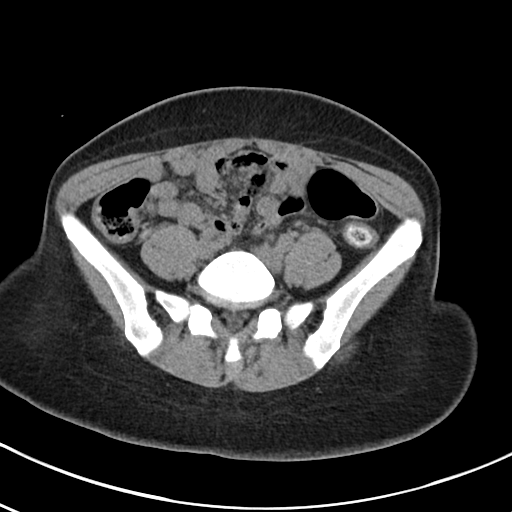
[im 51/97  soft-tissue]
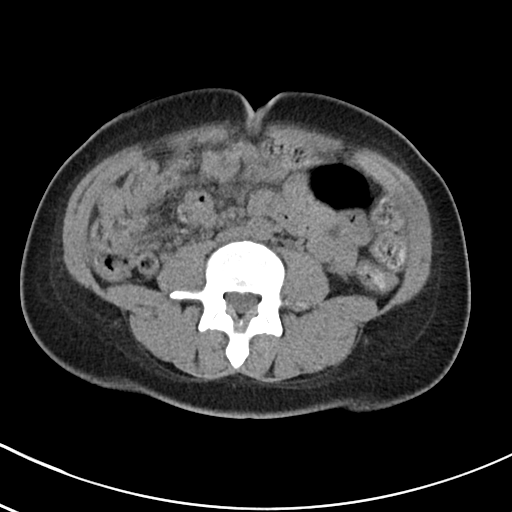
[im 56/97  soft-tissue]
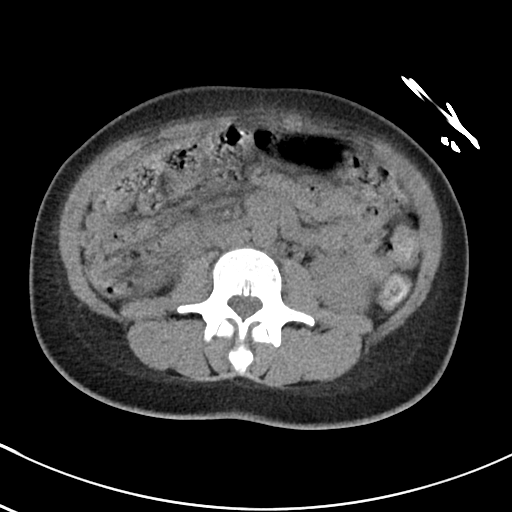
[im 61/97  soft-tissue]
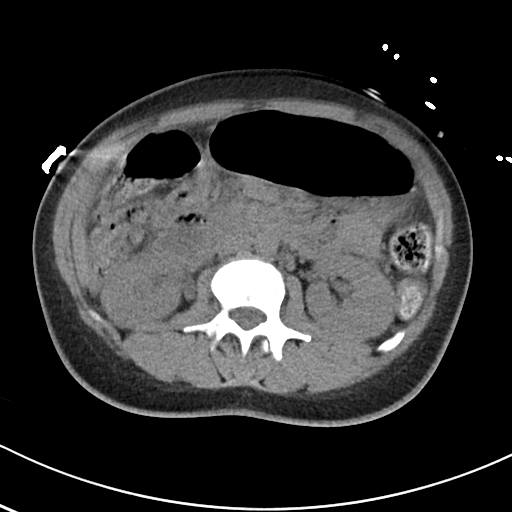
[im 61/97  bone]
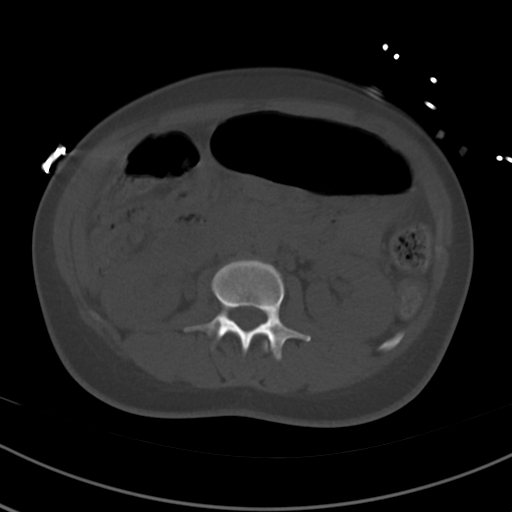
[im 71/97  soft-tissue]
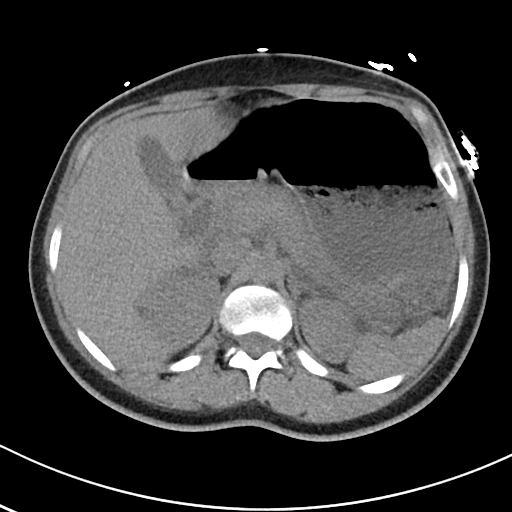
[im 76/97  soft-tissue]
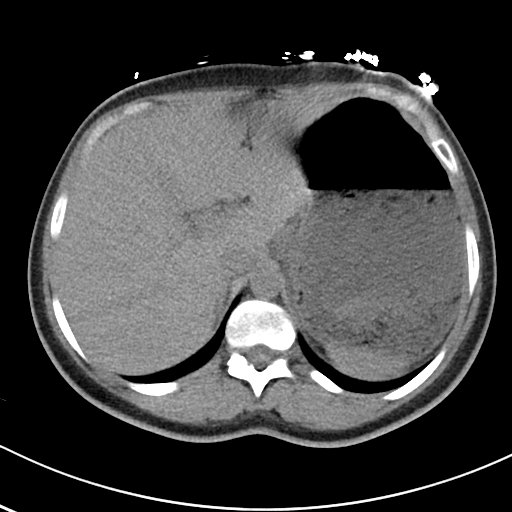
[im 81/97  soft-tissue]
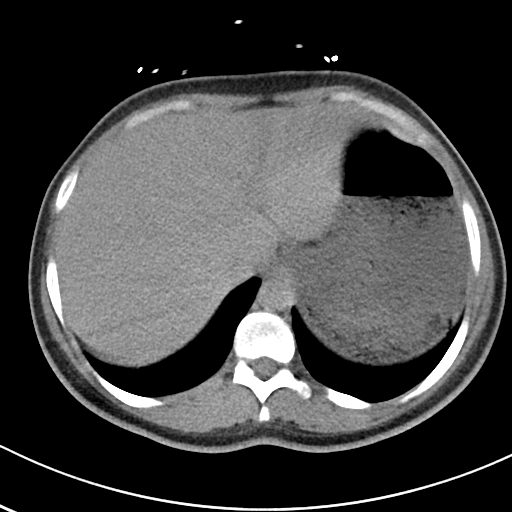
[im 91/97  soft-tissue]
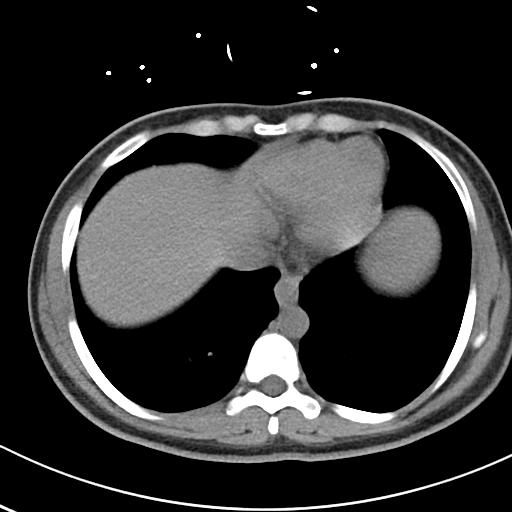

[Series 6: cor · coronal · 0.88mm/px · 3 of 82 slices shown]
[im 28/82  soft-tissue]
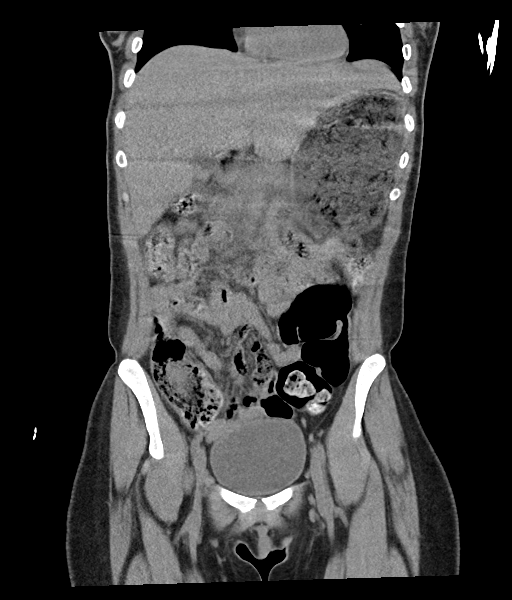
[im 37/82  soft-tissue]
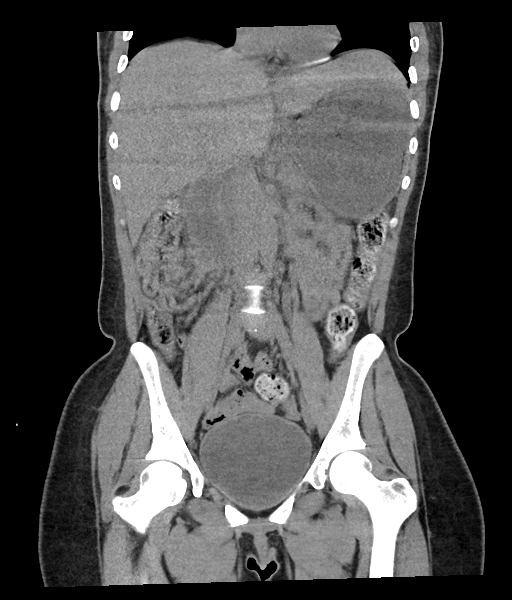
[im 46/82  soft-tissue]
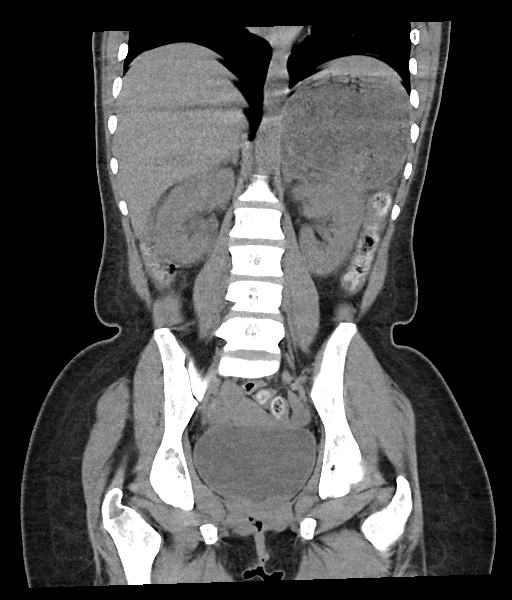

[16 of 46 positions shown; findings below may reference images not displayed]

FINDINGS: Lower chest: No acute abnormality.

Hepatobiliary: No solid liver abnormality is seen. No gallstones,
gallbladder wall thickening, or biliary dilatation.

Pancreas: Examination of the upper abdomen is generally quite
limited by breath motion artifact and lack of intravenous contrast.
Within this limitation, there appears to be peripancreatic edema and
a small volume of fluid (series 3, image 28). No obvious acute
pancreatic fluid collection.

Spleen: Normal in size without significant abnormality.

Adrenals/Urinary Tract: Adrenal glands are unremarkable. Kidneys are
normal, without renal calculi, solid lesion, or hydronephrosis.
Bladder is unremarkable.

Stomach/Bowel: Stomach is within normal limits. Normal appendix. No
evidence of bowel wall thickening, distention, or inflammatory
changes. Moderate burden of stool and stool balls throughout the
colon and rectum.

Vascular/Lymphatic: No significant vascular findings are present. No
enlarged abdominal or pelvic lymph nodes.

Reproductive: No mass or other significant abnormality.

Other: No abdominal wall hernia or abnormality. No ascites.

Musculoskeletal: No acute or significant osseous findings.
IMPRESSION: Examination of the upper abdomen is generally quite limited by
breath motion artifact and lack of intravenous contrast. Within this
limitation, there appears to be peripancreatic edema and a small
volume of fluid. No obvious acute pancreatic fluid collection.
Findings are consistent with acute pancreatitis. Contrast enhanced
examination with better breath motion control may be helpful to
assess further if complication is suspected.

## 2024-02-10 IMAGING — US US THYROID
2 series · 13 of 25 positions shown · non-contrast
Comparison: US Thyroid, 09/21/2016.

CLINICAL DATA: Goiter.

EXAM:
THYROID ULTRASOUND
TECHNIQUE: Ultrasound examination of the thyroid gland and adjacent soft
tissues was performed.

[Series 1: us thyroid · 12 of 37 slices shown (1 of 2)]
[im 1/37]
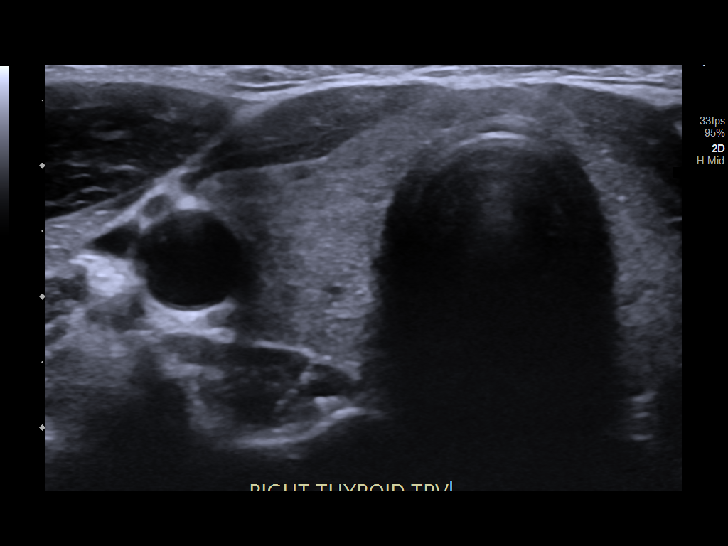
[im 4/37]
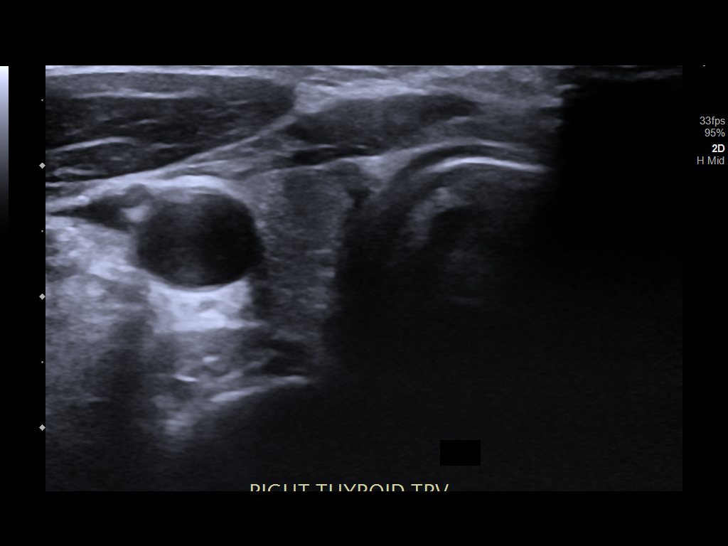
[im 7/37]
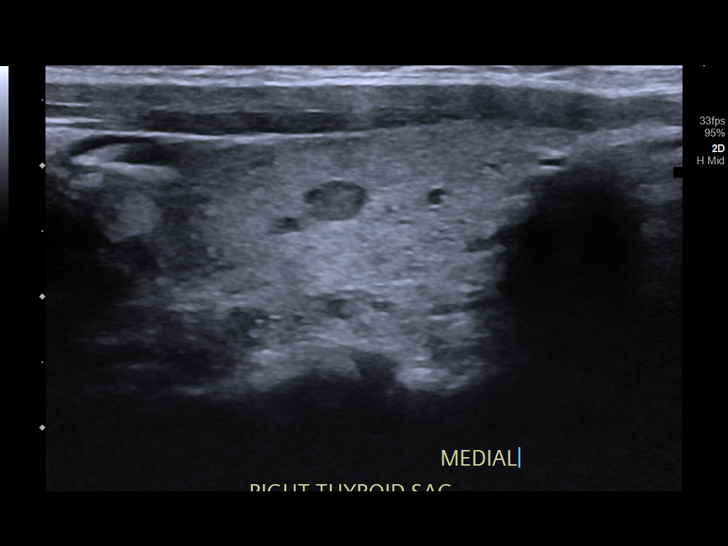
[im 10/37]
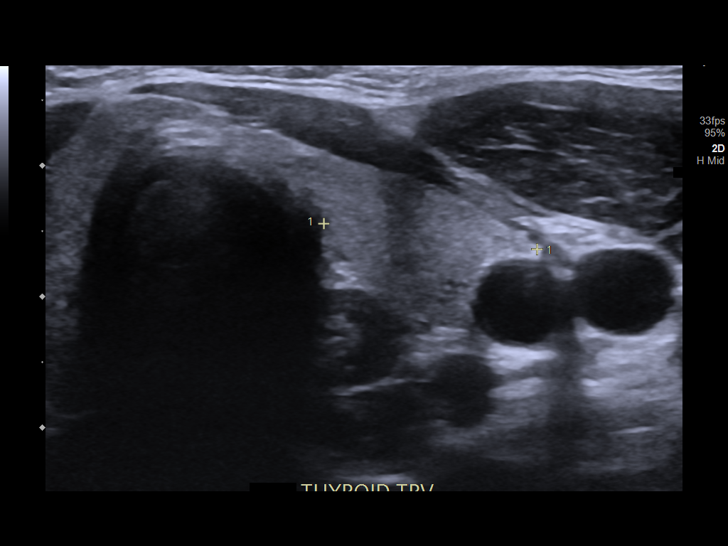
[im 13/37]
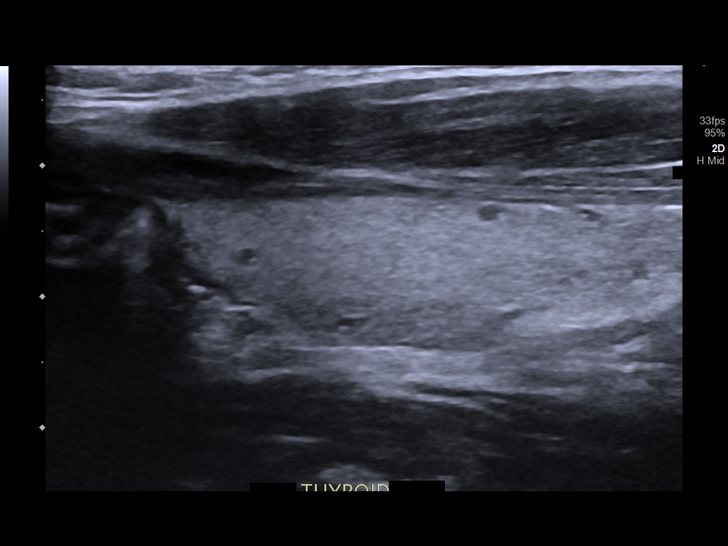
[im 16/37]
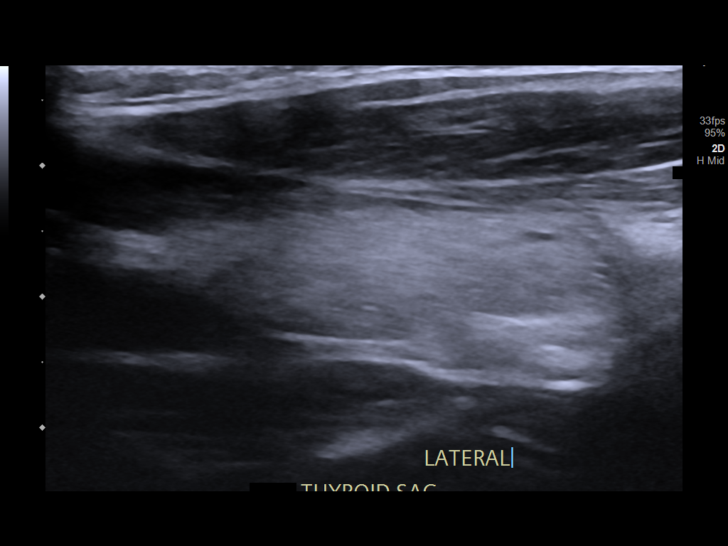
[im 19/37]
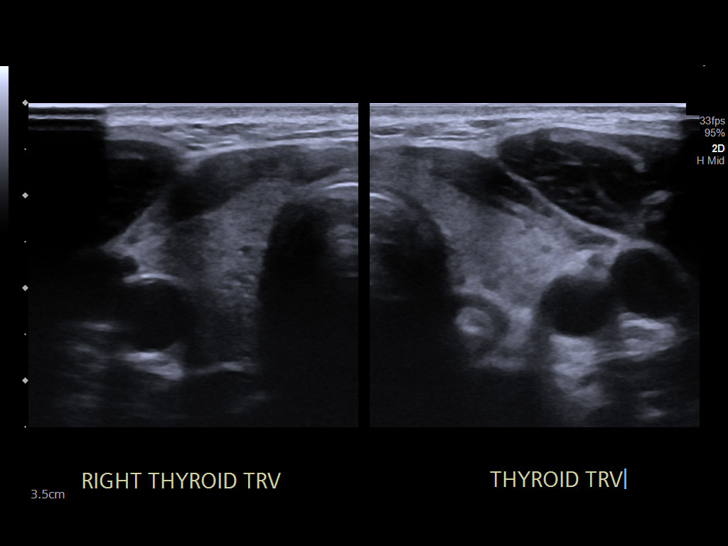
[im 22/37]
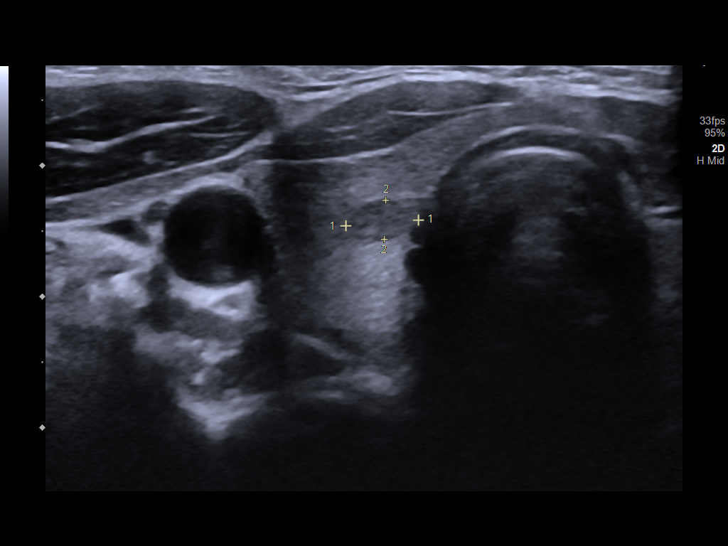
[im 26/37]
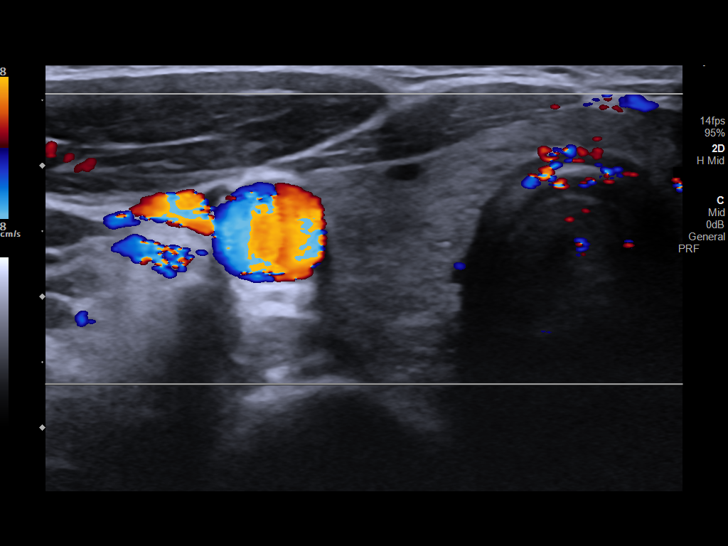
[im 29/37]
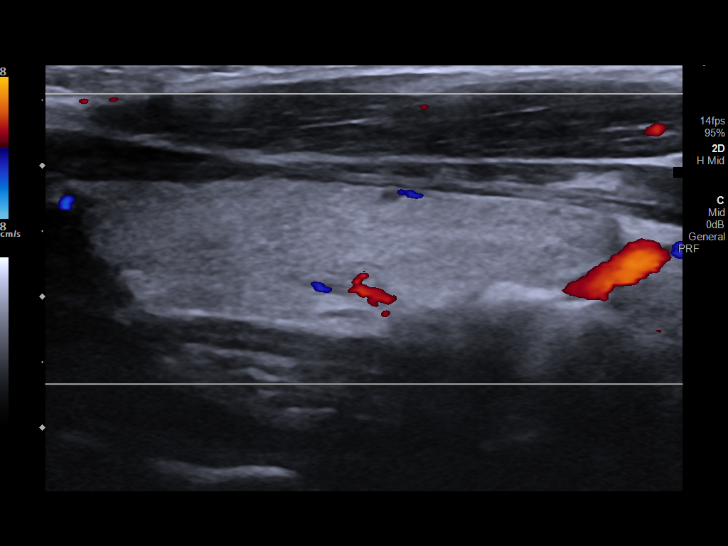
[im 32/37]
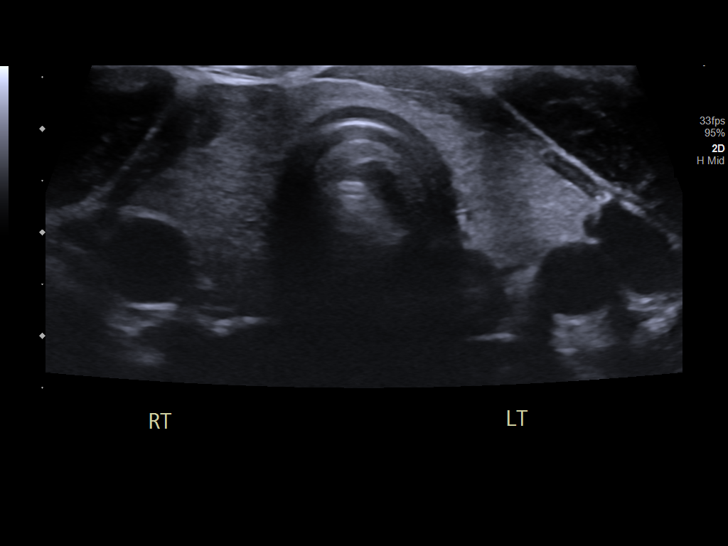
[im 35/37]
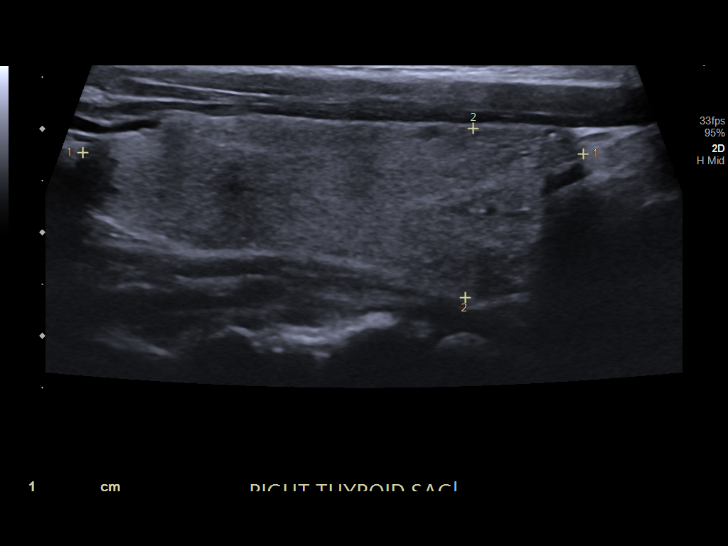

[Series 1001: us thyroid · 1 of 1 slices shown (2 of 2)]
[im 1/1]
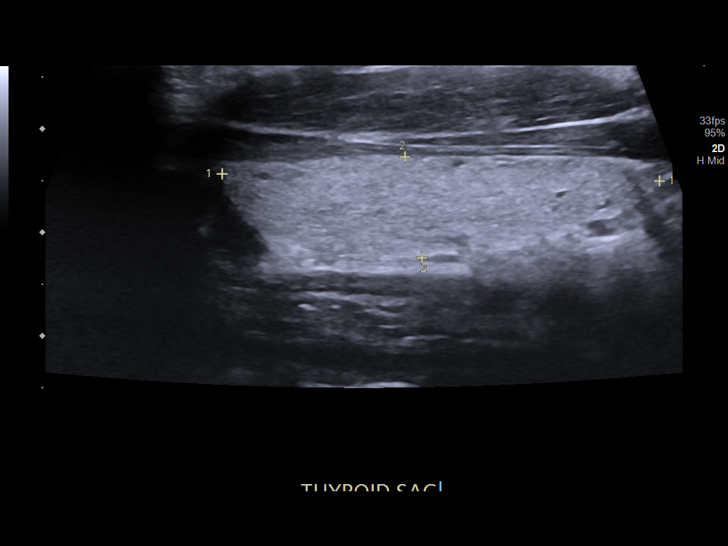

[13 of 25 positions shown; findings below may reference images not displayed]

FINDINGS: Parenchymal Echotexture: Normal

Isthmus: 0.3 cm

Right lobe: 4.8 x 1.6 x 1.4 cm

Left lobe: 4.2 x 1.0 x 1.4 cm

_________________________________________________________

Estimated total number of nodules >/= 1 cm: 0

Number of spongiform nodules >/=  2 cm not described below (TR1): 0

Number of mixed cystic and solid nodules >/= 1.5 cm not described
below (TR2): 0

_________________________________________________________

Jub-U cm RIGHT mid TR 4 thyroid nodule with maximum dimension of
cm.

Given size (<0.9 cm) and appearance, this nodule does NOT meet
TI-RADS criteria for biopsy or dedicated follow-up.

No cervical adenopathy or abnormal fluid collection within the
imaged neck.
IMPRESSION: 1. Normal nonenlarged thyroid.
2. Single sub-0 cm RIGHT thyroid nodule which does not meet TI-RADS
criteria for biopsy nor follow-up.

The above is in keeping with the ACR TI-RADS recommendations - [HOSPITAL] 8378;[DATE].

## 2024-02-10 IMAGING — DX DG CHEST 1V PORT
1 series · 1 of 1 positions shown · non-contrast
Comparison: March 25, 2017

CLINICAL DATA: Abdominal pain and vomiting.

EXAM:
PORTABLE CHEST 1 VIEW

[chest ap]
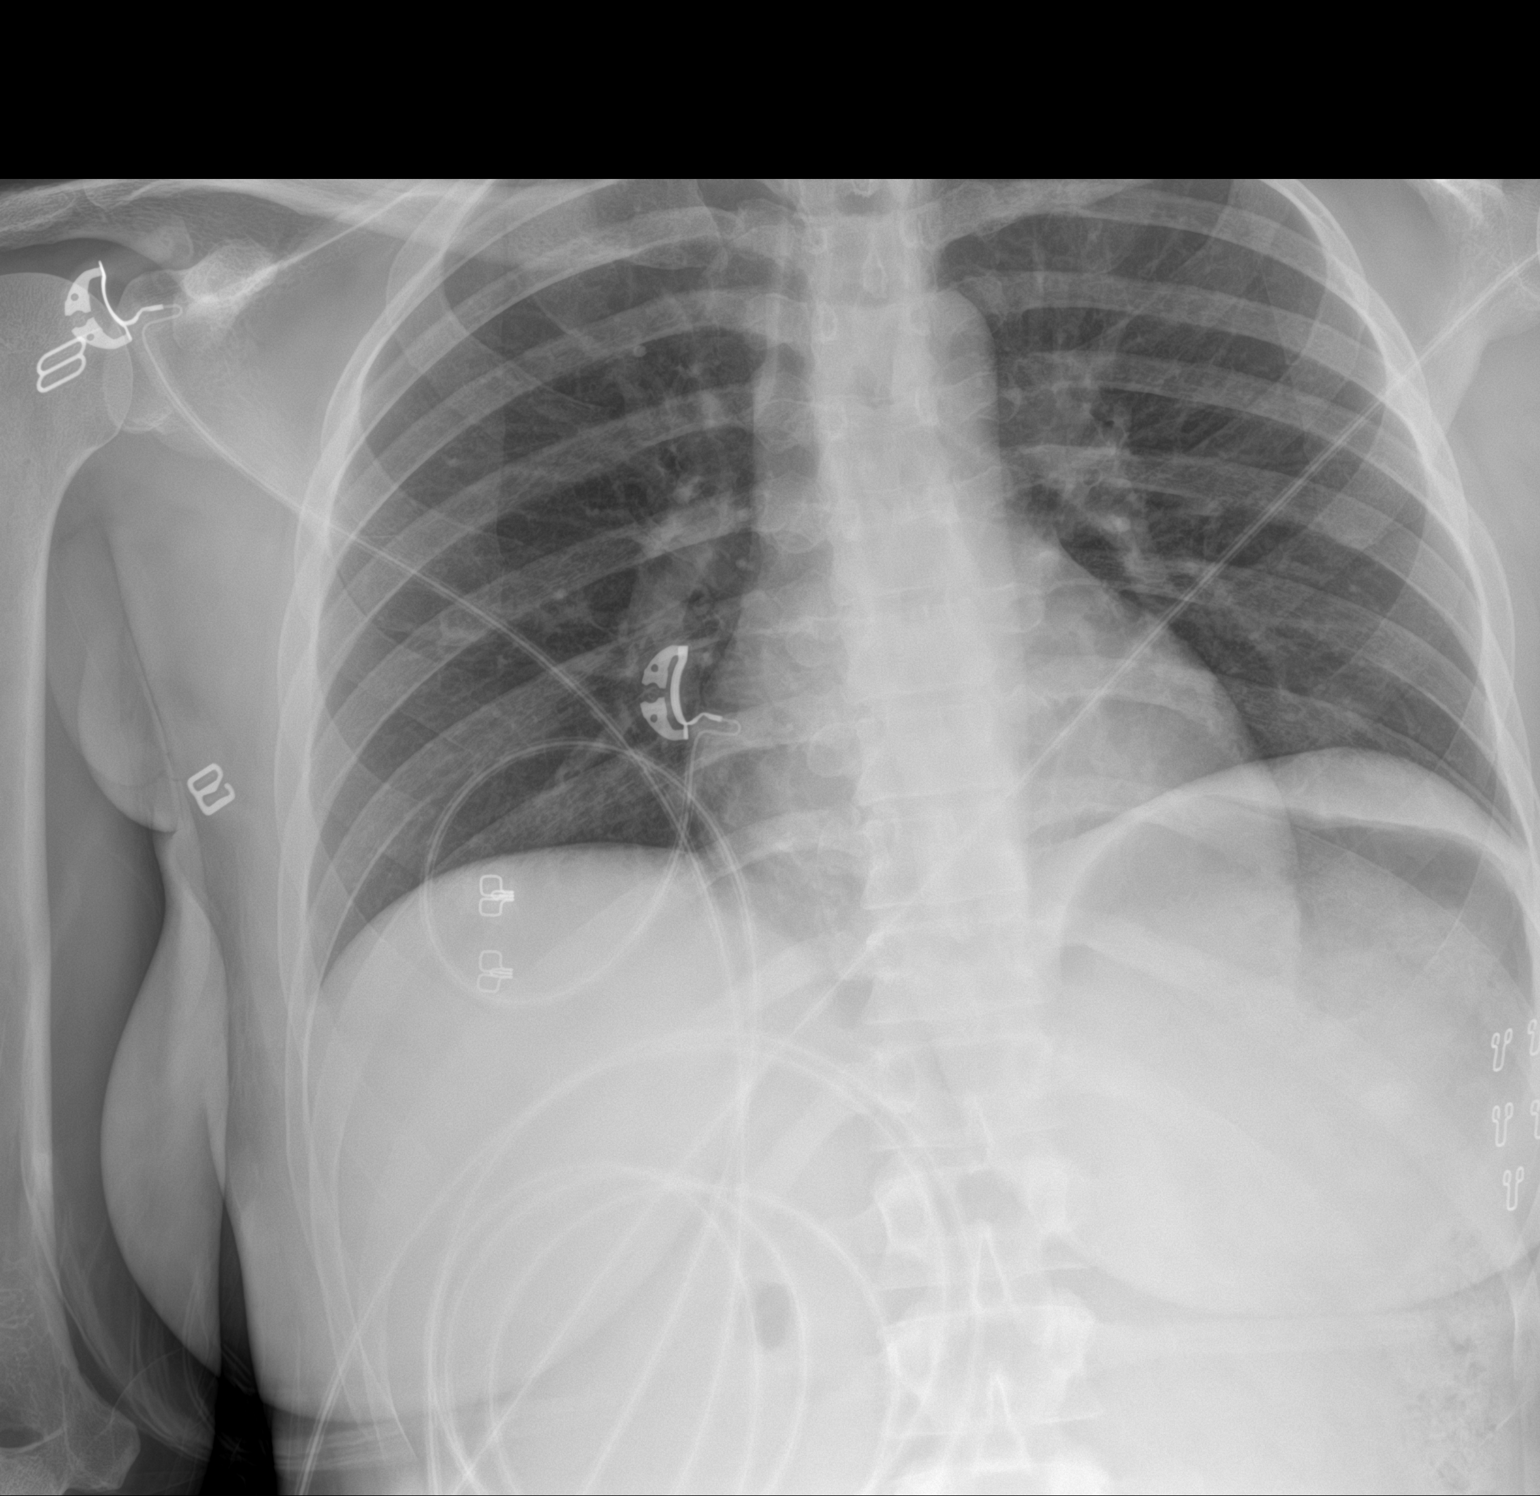

[1 of 1 positions shown; findings below may reference images not displayed]

FINDINGS: The heart size and mediastinal contours are within normal limits.
Both lungs are clear. The visualized skeletal structures are
unremarkable.
IMPRESSION: No active disease.

## 2024-02-10 MED ORDER — IBUPROFEN 800 MG PO TABS
800.0000 mg | ORAL_TABLET | Freq: Once | ORAL | Status: AC
  Administered 2024-02-10: 800 mg via ORAL
  Filled 2024-02-10: qty 1

## 2024-02-10 MED ORDER — ACETAMINOPHEN 500 MG PO TABS
1000.0000 mg | ORAL_TABLET | Freq: Once | ORAL | Status: AC
  Administered 2024-02-10: 1000 mg via ORAL
  Filled 2024-02-10: qty 2

## 2024-02-10 MED ORDER — SODIUM CHLORIDE 0.9 % IV BOLUS
1000.0000 mL | Freq: Once | INTRAVENOUS | Status: AC
  Administered 2024-02-10: 1000 mL via INTRAVENOUS

## 2024-02-10 NOTE — ED Provider Notes (Signed)
 " [place] EMERGENCY DEPARTMENT AT [facility] Provider Note   CSN: [id number] Arrival date & time: 02/10/24  9956     Patient presents with: Fever   [name] is a 28 y.o. female.   28 year old female presents with complaint of fevers and left side sore throat onset today.  No known sick contacts.  Also reports a headache, denies cough, congestion, body aches or sick contacts.  Patient is an insulin -dependent diabetic, blood sugar started running higher into the 300s on arrival in the ER.  Also history of asthma, lupus, GERD.       Prior to Admission medications  Medication Sig Start Date End Date Taking? Authorizing Provider  ACCRUFER 30 MG CAPS Take 1 capsule by mouth 2 (two) times daily.    [provider]  albuterol  (VENTOLIN  HFA) 108 (90 Base) MCG/ACT inhaler Inhale 1-2 puffs into the lungs every 6 (six) hours as needed for wheezing or shortness of breath. 12/30/23   Raspet, Erin K, PA-C  Blood Gluc Meter Disp-Strips (BLOOD GLUCOSE METER DISPOSABLE) DEVI Use as directed 07/30/13   Krishnan, Sendil K, MD  etonogestrel (NEXPLANON) 68 MG IMPL implant 1 each by Subdermal route once.    [provider]  hydrOXYzine (ATARAX) 25 MG tablet Take 25 mg by mouth as needed.    [provider]  ibuprofen  (ADVIL ) 600 MG tablet Take 1 tablet (600 mg total) by mouth every 8 (eight) hours as needed. 12/30/23   Raspet, Rocky POUR, PA-C  Insulin  Aspart PenFill 100 UNIT/ML SOCT by Pump Prime route as directed. USE VIA INSULIN  PUMP AS DIRECTED UP TO 60 UNITS PER DAY    [provider]  Insulin  Human (INSULIN  PUMP) SOLN Inject into the skin. Use in insulin  pump as directed 60 units per day    [provider]  Insulin  Pen Needle (PEN NEEDLES 3/16) 31G X 5 MM MISC Use as directed 07/30/13   Krishnan, Sendil K, MD  Insulin  Pen Needle 31G X 5 MM MISC Use with insulin  pen 08/17/12   Brennan, Michael J, MD  LANTUS  SOLOSTAR 100 UNIT/ML Solostar Pen  Inject into the skin daily as needed (blood sugar regulation (when pump not in use)).    [provider]  linaclotide (LINZESS) 145 MCG CAPS capsule Take 145 mcg by mouth daily before breakfast.    [provider]  ondansetron  (ZOFRAN -ODT) 8 MG disintegrating tablet Take 1 tablet (8 mg total) by mouth every 8 (eight) hours as needed for nausea or vomiting. 06/28/23   Randol Simmonds, MD  pantoprazole  (PROTONIX ) 40 MG tablet Take 40 mg by mouth 2 (two) times daily before a meal.    [provider]  pravastatin (PRAVACHOL) 80 MG tablet Take 80 mg by mouth daily.    [provider]  pregabalin (LYRICA) 50 MG capsule Take 50 mg by mouth 3 (three) times daily.    [provider]  promethazine -dextromethorphan (PROMETHAZINE -DM) 6.25-15 MG/5ML syrup Take 5 mLs by mouth 3 (three) times daily as needed for cough. 12/30/23   Raspet, Erin K, PA-C  Vitamin D, Ergocalciferol, 50000 units CAPS Take by mouth once a week.    [provider]    Allergies: Patient has no known allergies.    Review of Systems Negative except as per HPI Updated Vital Signs BP 119/63   Pulse (!) 107   Temp 99.6 F (37.6 C)   Resp 18   SpO2 100%   Physical Exam Vitals and nursing note  reviewed.  Constitutional:      General: She is not in acute distress.    Appearance: She is well-developed. She is not diaphoretic.  HENT:     Head: Normocephalic and atraumatic.     Nose: Nose normal.     Mouth/Throat:     Mouth: Mucous membranes are moist.     Pharynx: No oropharyngeal exudate or posterior oropharyngeal erythema.  Eyes:     Conjunctiva/sclera: Conjunctivae normal.  Cardiovascular:     Rate and Rhythm: Regular rhythm. Tachycardia present.     Heart sounds: Normal heart sounds.  Pulmonary:     Effort: Pulmonary effort is normal.     Breath sounds: Normal breath sounds.  Musculoskeletal:     Cervical back: Neck supple.  Lymphadenopathy:     Cervical: No cervical  adenopathy.  Skin:    General: Skin is warm and dry.     Findings: No erythema or rash.  Neurological:     Mental Status: She is alert and oriented to person, place, and time.  Psychiatric:        Behavior: Behavior normal.     (all labs ordered are listed, but only abnormal results are displayed) Labs Reviewed  COMPREHENSIVE METABOLIC PANEL WITH GFR - Abnormal; Notable for the following components:      Result Value   Glucose, Bld 214 (*)    All other components within normal limits  CBC WITH DIFFERENTIAL/PLATELET - Abnormal; Notable for the following components:   Hemoglobin 9.7 (*)    HCT 31.2 (*)    MCH 25.0 (*)    Lymphs Abs 0.5 (*)    All other components within normal limits  RESP PANEL BY RT-PCR (RSV, FLU A&B, COVID)  RVPGX2  GROUP A STREP BY PCR    EKG: None  Radiology: No results found.   Procedures   Medications Ordered in the ED  acetaminophen  (TYLENOL ) tablet 1,000 mg (1,000 mg Oral Given 02/10/24 0052)  sodium chloride  0.9 % bolus 1,000 mL (0 mLs Intravenous Stopped 02/10/24 0426)  ibuprofen  (ADVIL ) tablet 800 mg (800 mg Oral Given 02/10/24 0248)                                    Medical Decision Making Amount and/or Complexity of Data Reviewed Labs: ordered.  Risk OTC drugs. Prescription drug management.   This patient presents to the ED for concern of fever, sore throat, this involves an extensive number of treatment options, and is a complaint that carries with it a high risk of complications and morbidity.  The differential diagnosis includes but not limited to viral illness, strep, electrolyte or metabolic derangement   Co morbidities / Chronic conditions that complicate the patient evaluation  Anemia, diabetes   Additional history obtained:  Additional history obtained from EMR External records from outside source obtained and reviewed including prior labs and imaging on file   Lab Tests:  I Ordered, and personally interpreted  labs.  The pertinent results include: Negative for COVID, flu, RSV, strep.  CBC with hemoglobin of 9.7, history of anemia, not significantly changed.  CMP with elevated glucose at 214.    Problem List / ED Course / Critical interventions / Medication management  28 year old female with history of insulin -dependent diabetes presents with fever and sore throat.  On exam, patient is nontoxic and in no distress.  She is febrile on arrival and tachycardic.  Her blood  sugars have been running a little higher than typical for her although she notes she does need to refill the insulin  in her pump.  Her workup is reassuring other than elevated glucose.  Heart rate has improved with IV fluids, fever improved with oral medications.  Patient feeling improved and ready for discharge.  Recommend recheck with PCP and return to ER as needed. I ordered medication including Motrin , Tylenol , IV fluids Reevaluation of the patient after these medicines showed that the patient improved- I have reviewed the patients home medicines and have made adjustments as needed   Social Determinants of Health:  Has primary care provider    Test / Admission - Considered:  Stable for dc      Final diagnoses:  Fever, unspecified fever cause  Pharyngitis, unspecified etiology    ED Discharge Orders     None          Beverley Leita LABOR, PA-C 02/10/24 9495    Jerral Meth, MD 02/11/24 0139  "

## 2024-02-10 NOTE — ED Triage Notes (Signed)
 Patient reports fever this evening , also concerned about her blood pressure .

## 2024-02-10 NOTE — Discharge Instructions (Signed)
 Home to rest and hydrate. Take Motrin  and Tylenol  as needed directed for fever. Return to the emergency room for any worsening or concerning symptoms. Recheck with your primary care provider in the next few days.

## 2024-03-07 ENCOUNTER — Encounter: Payer: Self-pay | Admitting: Gastroenterology

## 2024-03-07 ENCOUNTER — Ambulatory Visit: Admitting: Gastroenterology

## 2024-03-07 VITALS — BP 122/78 | HR 98 | Ht 64.0 in | Wt 167.0 lb

## 2024-03-07 DIAGNOSIS — R1310 Dysphagia, unspecified: Secondary | ICD-10-CM

## 2024-03-07 DIAGNOSIS — R159 Full incontinence of feces: Secondary | ICD-10-CM

## 2024-03-07 DIAGNOSIS — K219 Gastro-esophageal reflux disease without esophagitis: Secondary | ICD-10-CM | POA: Diagnosis not present

## 2024-03-07 DIAGNOSIS — E109 Type 1 diabetes mellitus without complications: Secondary | ICD-10-CM

## 2024-03-07 DIAGNOSIS — K5909 Other constipation: Secondary | ICD-10-CM | POA: Diagnosis not present

## 2024-03-07 MED ORDER — VOQUEZNA 10 MG PO TABS: 1.0000 | ORAL_TABLET | Freq: Every day | ORAL | Status: AC

## 2024-03-07 MED ORDER — LINACLOTIDE 290 MCG PO CAPS: 290.0000 ug | ORAL_CAPSULE | Freq: Every day | ORAL | Status: AC

## 2024-03-07 NOTE — Progress Notes (Signed)
 "  Chief Complaint: chronic constipation, dysphagia, GERD Primary GI FI:[id number]   HPI: Discussed the use of AI scribe software for clinical note transcription with the patient, who gave verbal consent to proceed.  History of Present Illness   [name] is a 29 year old female with diabetes and irritable bowel syndrome who presents for evaluation of chronic constipation with recent onset fecal incontinence.  Fecal incontinence began in recent months, is severe, and occurs uncontrollably during sleep. She has never experienced this previously and required urology consultation and prescription of diapers due to severity. This symptom has significantly impacted her daily life and causes concern given her young age.  Constipation has been longstanding and refractory to multiple therapies. Current regimen includes Linzess  145 mcg daily, which provides inconsistent relief, with alternating days of inability to have a bowel movement and days of frequent, urgent trips to the bathroom. Previous treatments include fiber and Miralax . Abdominal x-ray in 2023 showed significant stool burden. Associated symptoms include gas, bloating, and abdominal discomfort, which she attributes to constipation. Abdominal distension occurs after eating and resolves after bowel movements. She sometimes needs to push to have a bowel movement. She has one child.  Frequent reflux symptoms are managed with pantoprazole  twice daily, which is effective most of the time, but intermittent breakthrough symptoms occur, particularly with overeating. She describes nasty burps and bloating after eating, which often coincide with her need to use the bathroom and reflux symptoms. Early satiety is present.  Intermittent dysphagia to solids is noted, with sensation of food getting stuck, but not with liquids. She does not recall any esophageal dilation during prior endoscopy performed within the past year or two. During that procedure,  food was noted in her stomach despite fasting, and a biopsy was taken.  Diabetes is present. Gastric emptying study performed nearly three years ago was normal (97% emptied at 4 hours), but she is concerned symptoms may have changed, as persistent bloating and early satiety continue. CT scan in May showed gallstones.  She previously tried atorvastatin and lisinopril together, which caused intolerable explosive diarrhea. She prefers pravastatin due to fewer gastrointestinal side effects and is concerned about medication side effects given her underlying irritable bowel syndrome and frequent diarrhea.   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     albuterol  (VENTOLIN  HFA) 108 (90 Base) MCG/ACT inhaler Inhale 1-2 puffs into the lungs every 6 (six) hours as needed for wheezing or shortness of breath. 6.7 g 0   Blood Gluc Meter Disp-Strips (BLOOD GLUCOSE METER  DISPOSABLE) DEVI Use as directed 100 each 0   etonogestrel (NEXPLANON) 68 MG IMPL implant 1 each by Subdermal route once.     hydrOXYzine (ATARAX) 25 MG tablet Take 25 mg by mouth as needed.     ibuprofen  (ADVIL ) 600 MG tablet Take 1 tablet (600 mg total) by mouth  every 8 (eight) hours as needed. 30 tablet 0   Insulin  Aspart PenFill 100 UNIT/ML SOCT by Pump Prime route as directed. USE VIA INSULIN  PUMP AS DIRECTED UP TO 60 UNITS PER DAY     Insulin  Human (INSULIN  PUMP) SOLN Inject into the skin. Use in insulin  pump as directed 60 units per day     Insulin  Pen Needle (PEN NEEDLES 3/16) 31G X 5 MM MISC Use as directed 100 each 1   Insulin  Pen Needle 31G X 5 MM MISC Use with insulin  pen 200 each 6   LANTUS  SOLOSTAR 100 UNIT/ML Solostar Pen Inject into the skin daily as needed (blood sugar regulation (when pump not in use)).     linaclotide  (LINZESS ) 145 MCG CAPS capsule Take 145 mcg by mouth daily before breakfast.     linaclotide  (LINZESS ) 290 MCG CAPS capsule Take 1 capsule (290 mcg total) by mouth daily before breakfast.     lisinopril (ZESTRIL) 5 MG tablet Take 5 mg by mouth daily.     ondansetron  (ZOFRAN -ODT) 8 MG disintegrating tablet Take 1 tablet (8 mg total) by mouth every 8 (eight) hours as needed for nausea or vomiting. 12 tablet 0   pantoprazole  (PROTONIX ) 40 MG tablet Take 40 mg by mouth 2 (two) times daily before a meal.     pravastatin (PRAVACHOL) 80 MG tablet Take 80 mg by mouth daily.     pregabalin (LYRICA) 50 MG capsule Take 50 mg by mouth 3 (three) times daily.     promethazine -dextromethorphan (PROMETHAZINE -DM) 6.25-15 MG/5ML syrup Take 5 mLs by mouth 3 (three) times daily as needed for cough. 118 mL 0   Vitamin D, Ergocalciferol, 50000 units CAPS Take by mouth once a week.     Vonoprazan Fumarate  (VOQUEZNA ) 10 MG TABS Take 1 tablet by mouth daily.     No current facility-administered medications for this visit.    Allergies as of 03/07/2024   (No Known Allergies)    Family History  Adopted: Yes  Problem Relation Age of Onset   Early death Sister    Asthma Maternal Aunt    Depression Maternal Aunt    Mental illness Maternal Aunt    Asthma Maternal Grandmother    Thyroid  disease Maternal Grandmother    Obesity Maternal  Grandmother    Heart disease Neg Hx    Diabetes Neg Hx     Social History   Socioeconomic History   Marital status: Single    Spouse name: Not on file   Number of children: Not on file   Years of education: Not on file   Highest education level: Not on file  Occupational History   Occupation: Student  Tobacco Use   Smoking status: Former    Current packs/day: 0.50    Average packs/day: 0.5 packs/day for 11.1 years (5.5 ttl pk-yrs)    Types: Cigarettes    Start date: 2015   Smokeless tobacco: Never  Vaping Use   Vaping status: Never Used  Substance and Sexual Activity   Alcohol use: No   Drug use: No   Sexual activity: Yes    Birth control/protection: Patch, Other-see comments    Comment: Mother  states that patient no longer uses the patch because she is no longer sexually active   Other Topics Concern   Not on file  Social History Narrative   Lives with a friend.   In 12th grade.   Social Drivers of Health   Tobacco Use: Medium Risk (03/07/2024)   Patient History    Smoking Tobacco Use: Former    Smokeless Tobacco Use: Never    Passive Exposure: Not on file  Financial Resource Strain: Low Risk (06/13/2023)   Received from [facility]   Overall Financial Resource Strain (CARDIA)    Difficulty of Paying Living Expenses: Not very hard  Food Insecurity: No Food Insecurity (06/13/2023)   Received from [facility]   Epic    Within the past 12 months, you worried that your food would run out before you got the money to buy more.: Never true    Within the past 12 months, the food you bought just didn't last and you didn't have money to get more.: Never true  Transportation Needs: No Transportation Needs (06/13/2023)   Received from [facility] - Transportation    Lack of Transportation (Medical): No    Lack of Transportation (Non-Medical): No  Physical Activity: Sufficiently Active (06/13/2023)   Received from [facility]   Exercise Vital Sign    On  average, how many days per week do you engage in moderate to strenuous exercise (like a brisk walk)?: 4 days    On average, how many minutes do you engage in exercise at this level?: 60 min  Stress: Stress Concern Present (06/13/2023)   Received from [facility] - Occupational Stress Questionnaire    Feeling of Stress : To some extent  Social Connections: Somewhat Isolated (06/13/2023)   Received from [facility]   Social Network    How would you rate your social network (family, work, friends)?: Restricted participation with some degree of social isolation  Intimate Partner Violence: Not At Risk (06/13/2023)   Received from [facility]   HITS    Over the last 12 months how often did your partner physically hurt you?: Never    Over the last 12 months how often did your partner insult you or talk down to you?: Never    Over the last 12 months how often did your partner threaten you with physical harm?: Never    Over the last 12 months how often did your partner scream or curse at you?: Never  Depression (PHQ2-9): Not on file  Alcohol Screen: Not on file  Housing: Low Risk (06/13/2023)   Received from [facility]    In the last 12 months, was there a time when you were not able to pay the mortgage or rent on time?: No    In the past 12 months, how many times have you moved where you were living?: 1    At any time in the past 12 months, were you homeless or living in a shelter (including now)?: No  Utilities: Not At Risk (06/13/2023)   Received from [facility] Utilities    Threatened with loss of utilities: No  Health Literacy: Not on file    Review of Systems:    Constitutional: No weight loss, fever, chills, weakness or fatigue HEENT: Eyes: No change in vision               Ears, Nose, Throat:  No change in hearing or congestion Skin: No rash or itching Cardiovascular: No chest pain, chest pressure or palpitations    Respiratory: No SOB or cough Gastrointestinal: See HPI and otherwise negative Genitourinary: No dysuria or change in urinary frequency Neurological: No headache, dizziness or syncope Musculoskeletal: No new muscle or joint pain Hematologic: No bleeding or bruising Psychiatric: No history of depression or anxiety    Physical Exam:  Vital signs: BP 122/78   Pulse 98   Ht 5' 4 (1.626 m)   Wt 167 lb (75.8 kg)   BMI 28.67 kg/m   Constitutional: NAD, alert and cooperative Head:  Normocephalic and atraumatic. Eyes:   PEERL, EOMI. No icterus. Conjunctiva pink. Respiratory: Respirations even and unlabored. Lungs clear to auscultation bilaterally.   No wheezes, crackles, or rhonchi.  Cardiovascular:  Regular rate and rhythm. No peripheral edema, cyanosis or pallor.  Gastrointestinal:  Soft, nondistended, nontender. No rebound or guarding. Normal bowel sounds. No appreciable masses or hepatomegaly. Rectal:  Declines Msk:  Symmetrical without gross deformities. Without edema, no deformity or joint abnormality.  Neurologic:  Alert and  oriented x4;  grossly normal neurologically.  Skin:   Dry and intact without significant lesions or rashes. Psychiatric: Oriented to person, place and time. Demonstrates good judgement and reason without abnormal affect or behaviors.  Physical Exam    RELEVANT LABS AND IMAGING: CBC    Component Value Date/Time   WBC 8.5 02/10/2024 0242   RBC 3.88 02/10/2024 0242   HGB 9.7 (L) 02/10/2024 0242   HCT 31.2 (L) 02/10/2024 0242   PLT 383 02/10/2024 0242   MCV 80.4 02/10/2024 0242   MCH 25.0 (L) 02/10/2024 0242   MCHC 31.1 02/10/2024 0242   RDW 15.0 02/10/2024 0242   LYMPHSABS 0.5 (L) 02/10/2024 0242   MONOABS 0.6 02/10/2024 0242   EOSABS 0.1 02/10/2024 0242   BASOSABS 0.0 02/10/2024 0242    CMP     Component Value Date/Time   NA 135 02/10/2024 0242   K 3.9 02/10/2024 0242   CL 102 02/10/2024 0242   CO2 23 02/10/2024 0242   GLUCOSE 214 (H)  02/10/2024 0242   BUN 9 02/10/2024 0242   CREATININE 0.86 02/10/2024 0242   CREATININE 0.78 08/25/2011 1116   CALCIUM 9.2 02/10/2024 0242   PROT 7.8 02/10/2024 0242   ALBUMIN 4.1 02/10/2024 0242   AST 23 02/10/2024 0242   ALT 7 02/10/2024 0242   ALKPHOS 57 02/10/2024 0242   BILITOT 0.3 02/10/2024 0242   GFRNONAA >60 02/10/2024 0242   GFRNONAA >89 08/25/2011 1116   GFRAA >60 05/03/2017 0653   GFRAA >89 08/25/2011 1116     Assessment/Plan:   Chronic constipation with fecal incontinence Symptoms worsened with alternating constipation and frequent bowel movements. Pelvic floor dysfunction suspected. Previous KUB with large stool burden. Current Linzess  dose inadequate. - Increased Linzess  to 290 mcg daily, provided samples. - patient moving to [place] in 2 weeks, advised to establish with GI there and consider colonoscopy if persisting symptoms - Recommended discussing pelvic floor physical therapy with future providers.  Gastroesophageal reflux disease GERD partially controlled with pantoprazole , with some breakthrough symptoms, particularly with overeating and constipation. Reflux exacerbated by bowel dysfunction. - Discussed switching to Voquezna  10 mg once daily, provided samples. - Advised monitoring for symptom improvement, report if symptoms persist. - Provided education life style modifications  Dysphagia Intermittent dysphagia to solids, likely related to globus sensation secondary to reflux. No history of esophageal dilation or intervention. Recent EGD this  year with [facility] - Ordered barium swallow to evaluate esophageal motility and structural abnormalities. - Advised further evaluation, including repeat endoscopy, if symptoms persist after initial interventions. - obtain previous EGD  Suspected gastroparesis Increased risk due to diabetes. Prior gastric emptying study normal, but recent symptoms suggest possible delayed gastric emptying. - Discussed  possibility of repeating gastric emptying study if symptoms persist. - Provided gastroparesis diet education (six small meals per day). - Discussed metoclopramide  (Reglan ) as potential future therapy, including risks of permanent facial spasms, advised caution if prescribed. - Requested records from previous endoscopy at [facility] for further review as patient was told she had food in her stomach despite fasting for her EGD  Assigned to Dr. Abran (Wednesday)  Nestor Blower, PA-C [facility] 03/07/2024, 3:45 PM  Cc: Jerome Heron Ruth, PA-C "

## 2024-03-07 NOTE — Patient Instructions (Signed)
 We will request your EGD from [facility] on [address]   You have been scheduled for a Barium Esophogram at [facility] (1st floor of the hospital) on 04/06/2024 at 9:00am. Please arrive 15 minutes prior to your appointment for registration. Make certain not to have anything to eat or drink 3 hours prior to your test. If you need to reschedule for any reason, please contact radiology at [phone] to do so. __________________________________________________________________ A barium swallow is an examination that concentrates on views of the esophagus. This tends to be a double contrast exam (barium and two liquids which, when combined, create a gas to distend the wall of the oesophagus) or single contrast (non-ionic iodine based). The study is usually tailored to your symptoms so a good history is essential. Attention is paid during the study to the form, structure and configuration of the esophagus, looking for functional disorders (such as aspiration, dysphagia, achalasia, motility and reflux) EXAMINATION You may be asked to change into a gown, depending on the type of swallow being performed. A radiologist and radiographer will perform the procedure. The radiologist will advise you of the type of contrast selected for your procedure and direct you during the exam. You will be asked to stand, sit or lie in several different positions and to hold a small amount of fluid in your mouth before being asked to swallow while the imaging is performed .In some instances you may be asked to swallow barium coated marshmallows to assess the motility of a solid food bolus. The exam can be recorded as a digital or video fluoroscopy procedure. POST PROCEDURE It will take 1-2 days for the barium to pass through your system. To facilitate this, it is important, unless otherwise directed, to increase your fluids for the next 24-48hrs and to resume your normal diet.  This test typically takes about 30 minutes  to perform. __________________________________________________________________________________  Linzess  works best when taken once a day every day, on an empty stomach, at least 30 minutes before your first meal of the day.  When Linzess  is taken daily as directed:  *Constipation relief is typically felt in about a week *IBS-C patients may begin to experience relief from belly pain and overall abdominal symptoms (pain, discomfort, and bloating) in about 1 week,   with symptoms typically improving over 12 weeks.  Diarrhea may occur in the first 2 weeks -keep taking it.  The diarrhea should go away and you should start having normal, complete, full bowel movements. It may be helpful to start treatment when you can be near the comfort of your own bathroom, such as a weekend.   We have provided you with samples to try of the .   We are providing you with samples of Voquezna  to try take one daily either an hour prior to food or 2 hours after food.   I appreciate the opportunity to care for you. Nestor Blower, PA

## 2024-03-07 NOTE — Progress Notes (Signed)
 Noted.

## 2024-03-22 ENCOUNTER — Telehealth: Payer: Self-pay | Admitting: Gastroenterology

## 2024-03-22 NOTE — Telephone Encounter (Signed)
 Pt states the dose of lizess is working and she would like a prescription sent to the pharmacy. Pt reports she is still having issues with reflux and is asking if she may need a highter dose of the voquezna . Please advise.

## 2024-03-22 NOTE — Telephone Encounter (Signed)
 Patient called stating the high dose of linzess  has been going good and she would like prescription. Also states she is stil having issues with GERD with voquenza and believes she may need a higher dosage. Requesting a call back. Please advise, thank you.

## 2024-04-06 ENCOUNTER — Other Ambulatory Visit (HOSPITAL_COMMUNITY)
